# Patient Record
Sex: Female | Born: 1940 | Race: Black or African American | Hispanic: No | Marital: Married | Smoking: Never smoker
Health system: Southern US, Community
[De-identification: ages and names within clinical notes are randomized; demographics above are authoritative.]

## PROBLEM LIST (undated history)

## (undated) DIAGNOSIS — J349 Unspecified disorder of nose and nasal sinuses: Secondary | ICD-10-CM

## (undated) DIAGNOSIS — R040 Epistaxis: Secondary | ICD-10-CM

## (undated) DIAGNOSIS — R233 Spontaneous ecchymoses: Secondary | ICD-10-CM

## (undated) DIAGNOSIS — D649 Anemia, unspecified: Secondary | ICD-10-CM

## (undated) DIAGNOSIS — R238 Other skin changes: Secondary | ICD-10-CM

## (undated) HISTORY — DX: Other skin changes: R23.8

## (undated) HISTORY — PX: NOSE SURGERY: SHX723

## (undated) HISTORY — PX: OTHER SURGICAL HISTORY: SHX169

## (undated) HISTORY — DX: Unspecified disorder of nose and nasal sinuses: J34.9

## (undated) HISTORY — DX: Epistaxis: R04.0

## (undated) HISTORY — PX: FOOT SURGERY: SHX648

## (undated) HISTORY — DX: Anemia, unspecified: D64.9

## (undated) HISTORY — DX: Spontaneous ecchymoses: R23.3

---

## 2008-04-07 ENCOUNTER — Encounter: Payer: Self-pay | Admitting: Endocrinology

## 2008-10-12 ENCOUNTER — Encounter: Payer: Self-pay | Admitting: Endocrinology

## 2008-10-27 ENCOUNTER — Ambulatory Visit: Payer: Self-pay | Admitting: Endocrinology

## 2008-10-27 DIAGNOSIS — K219 Gastro-esophageal reflux disease without esophagitis: Secondary | ICD-10-CM

## 2008-10-27 DIAGNOSIS — M1990 Unspecified osteoarthritis, unspecified site: Secondary | ICD-10-CM | POA: Insufficient documentation

## 2008-10-27 DIAGNOSIS — F411 Generalized anxiety disorder: Secondary | ICD-10-CM | POA: Insufficient documentation

## 2008-10-27 DIAGNOSIS — E210 Primary hyperparathyroidism: Secondary | ICD-10-CM | POA: Insufficient documentation

## 2008-11-09 ENCOUNTER — Encounter: Payer: Self-pay | Admitting: Endocrinology

## 2008-11-09 ENCOUNTER — Ambulatory Visit: Payer: Self-pay | Admitting: Internal Medicine

## 2009-05-04 ENCOUNTER — Ambulatory Visit: Payer: Self-pay | Admitting: Endocrinology

## 2009-05-05 LAB — CONVERTED CEMR LAB
Calcium, Total (PTH): 9.7 mg/dL (ref 8.4–10.5)
PTH: 91.7 pg/mL — ABNORMAL HIGH (ref 14.0–72.0)

## 2014-05-06 ENCOUNTER — Ambulatory Visit (INDEPENDENT_AMBULATORY_CARE_PROVIDER_SITE_OTHER): Payer: Commercial Managed Care - HMO

## 2014-05-06 VITALS — BP 117/101 | HR 74 | Resp 16

## 2014-05-06 DIAGNOSIS — M79606 Pain in leg, unspecified: Secondary | ICD-10-CM

## 2014-05-06 DIAGNOSIS — Q828 Other specified congenital malformations of skin: Secondary | ICD-10-CM

## 2014-05-06 DIAGNOSIS — B070 Plantar wart: Secondary | ICD-10-CM

## 2014-05-06 NOTE — Progress Notes (Signed)
Subjective:     Patient ID: Jennifer Holden, female   DOB: 27-Feb-1941, 73 y.o.   MRN: [id number]  HPI There are some places on the bottom of my heels that has been going on for about 4 months and can't hardly walk and burns and throbs and gets numb and tingles at times and uses pads and I have had blood transfusions and hurts with shoes and broke my right leg 6 months ago   Review of Systems  Constitutional: Positive for chills, activity change and unexpected weight change.  HENT:       Ringing in ears  Eyes: Positive for itching and visual disturbance.  Respiratory: Positive for cough and shortness of breath.   Cardiovascular: Positive for chest pain and leg swelling.       Pain with walking (ankle)  Gastrointestinal: Positive for abdominal pain and constipation.  Endocrine: Positive for cold intolerance.       Increase urination  Musculoskeletal: Positive for back pain and gait problem.  Neurological: Positive for dizziness, weakness and numbness.  Hematological: Bruises/bleeds easily.  Psychiatric/Behavioral: The patient is nervous/anxious.   All other systems reviewed and are negative.      Objective:   Physical Exam Lower extremity findings as follows this 73 year old female process this time with a daughter-in-law with approxi-six-month history or longer history 4 lesions on the bottom of her foot inferior heel areas they're painful difficult to walk hardly keratotic lesions are noted. Patient is multiple petechial areas on both feet and throughout her body some bleeding or bruising issues of your nosebleeds all the time and they have difficult time controlling her bleeding patient is had multiple transfusions apparently. Lower extremity objective findings as follows vascular status reveals pulses are palpable DP and PT +2 over 4 bilateral capillary refill time 3 seconds all digits epicritic sensations intact and symmetric bilateral there is normal plantar response DTRs not elicited  dermatologically skin color pigment normal hair growth absent there are multiple petechiae around bottom of the foot dorsal foot and throughout the leg and body general. No open wounds no signs of infection the keratotic lesions have a hard darkened central area with multiple darkened spots consistent with verruca versus porokeratosis. 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     Plan:     Plan at this time did make an attempt at debriding D roofing the verruca on the right heel however immediately considerable amount of bleeding and drainage was noted from the pinpoint bleeds following attempted debridement we discontinued this lumicain and Neosporin and a gauze wrap were applied to the right foot and heel to help control the bleeding pressure was applied for more than 5 minutes to decrease the flow again a small pinpoint bleeding from the verruca was and it was identified and no additional debridement was recommended because of this also recommended no chemical treatment salicylic acid or anything that causes blistering which would infect likely create bleeding or other issues the contralateral foot was also not address at this time as any debridement would likely cause difficulty with controlling the bleeding recommendations time is guarded accommodative care. Some half-inch Plastizote heel cups are placed in both of her shoes with pockets for the verrucous lesions to help offload the areas large thick padded dressing was applied to the right heel following debridement today it was  dressed with Silvadene Neosporin and a large thick gauze dressing and Coflex wrap. Leave this in place for at least 24 hours until bleeding is under control. Contact us if there's any other issues or difficulties or bleeding fails to resolve in the next  24 hours. Patient and family member indicated there comfortable keeping bleeding under control because a constant issue to them. Dispensed literature on verruca may consider just applying topical duct tape with any chemical treatments or medications suggest follow-up in the future on an as-needed basis if there's any exacerbations changes or worsening  Harriet Masson DPM

## 2014-05-06 NOTE — Patient Instructions (Signed)
Plantar Warts Warts are benign (noncancerous) growths of the outer skin layer. They can occur at any time in life but are most common during childhood and the teen years. Warts can occur on many skin surfaces of the body. When they occur on the underside (sole) of your foot they are called plantar warts. They often emerge in groups with several small warts encircling a larger growth. CAUSES  Human papillomavirus (HPV) is the cause of plantar warts. HPV attacks a break in the skin of the foot. Walking barefoot can lead to exposure to the wart virus. Plantar warts tend to develop over areas of pressure such as the heel and ball of the foot. Plantar warts often grow into the deeper layers of skin. They may spread to other areas of the sole but cannot spread to other areas of the body. SYMPTOMS  You may also notice a growth on the undersurface of your foot. The wart may grow directly into the sole of the foot, or rise above the surface of the skin on the sole of the foot, or both. They are most often flat from pressure. Warts generally do not cause itching but may cause pain in the area of the wart when you put weight on your foot. DIAGNOSIS  Diagnosis is made by physical examination. This means your caregiver discovers it while examining your foot.  TREATMENT  There are many ways to treat plantar warts. However, warts are very tough. Sometimes it is difficult to treat them so that they go away completely and do not grow back. Any treatment must be done regularly to work. If left untreated, most plantar warts will eventually disappear over a period of one to two years. Treatments you can do at home include:  Putting duct tape over the top of the wart (occlusion) has been found to be effective over several months. The duct tape should be removed each night and reapplied until the wart has disappeared.  Placing over-the-counter medications on top of the wart to help kill the wart virus and remove the wart  tissue (salicylic acid, cantharidin, and dichloroacetic acid) are useful. These are called keratolytic agents. These medications make the skin soft and gradually layers will shed away. These compounds are usually placed on the wart each night and then covered with a bandage. They are also available in premedicated bandage form. Avoid surrounding skin when applying these liquids as these medications can burn healthy skin. The treatment may take several months of nightly use to be effective.  Cryotherapy to freeze the wart has recently become available over-the-counter for children 4 years and older. This system makes use of a soft narrow applicator connected to a bottle of compressed cold liquid that is applied directly to the wart. This medication can burn healthy skin and should be used with caution.  As with all over-the-counter medications, read the directions carefully before use. Treatments generally done in your caregiver's office include:  Some aggressive treatments may cause discomfort, discoloration, and scarring of the surrounding skin. The risks and benefits of treatment should be discussed with your caregiver.  Freezing the wart with liquid nitrogen (cryotherapy, see above).  Burning the wart with use of very high heat (cautery).  Injecting medication into the wart.  Surgically removing or laser treatment of the wart.  Your caregiver may refer you to a dermatologist for difficult to treat large-sized warts or large numbers of warts. HOME CARE INSTRUCTIONS   Soak the affected area in warm water. Dry the   area completely when you are done. Remove the top layer of softened skin, then apply the chosen topical medication and reapply a bandage.  Remove the bandage daily and file excess wart tissue (pumice stone works well for this purpose). Repeat the entire process daily or every other day for weeks until the plantar wart disappears.  Several brands of salicylic acid pads are available  as over-the-counter remedies.  Pain can be relieved by wearing a donut bandage. This is a bandage with a hole in it. The bandage is put on with the hole over the wart. This helps take the pressure off the wart and gives pain relief. To help prevent plantar warts:  Wear shoes and socks and change them daily.  Keep feet clean and dry.  Check your feet and your children's feet regularly.  Avoid direct contact with warts on other people.  Have growths or changes on your skin checked by your caregiver. Document Released: 08/31/2003 Document Revised: 10/25/2013 Document Reviewed: 02/08/2009 [facility] Patient Information 2015 [place], [place]. This information is not intended to replace advice given to you by your health care provider. Make sure you discuss any questions you have with your health care provider.   Because of the fragility of skin and severe bleeding issues I do not recommend any topical medications wart medications are acids or debriding agents.  Recommend keeping a cushion on the bottom of the foot to offload pressure to the painful lesions, might consider covering these lesions with duct tape to try to suffocate or dry out the lesions without putting any chemical medications on.

## 2014-06-27 DIAGNOSIS — Z0000 Encounter for general adult medical examination without abnormal findings: Secondary | ICD-10-CM | POA: Diagnosis not present

## 2014-06-27 DIAGNOSIS — M810 Age-related osteoporosis without current pathological fracture: Secondary | ICD-10-CM | POA: Diagnosis not present

## 2014-06-27 DIAGNOSIS — D649 Anemia, unspecified: Secondary | ICD-10-CM | POA: Diagnosis not present

## 2014-06-27 DIAGNOSIS — E039 Hypothyroidism, unspecified: Secondary | ICD-10-CM | POA: Diagnosis not present

## 2014-06-27 DIAGNOSIS — G894 Chronic pain syndrome: Secondary | ICD-10-CM | POA: Diagnosis not present

## 2014-06-27 DIAGNOSIS — Z1389 Encounter for screening for other disorder: Secondary | ICD-10-CM | POA: Diagnosis not present

## 2014-06-27 DIAGNOSIS — E785 Hyperlipidemia, unspecified: Secondary | ICD-10-CM | POA: Diagnosis not present

## 2014-07-01 DIAGNOSIS — D649 Anemia, unspecified: Secondary | ICD-10-CM | POA: Diagnosis not present

## 2014-07-08 DIAGNOSIS — D649 Anemia, unspecified: Secondary | ICD-10-CM | POA: Diagnosis not present

## 2014-07-27 DIAGNOSIS — D649 Anemia, unspecified: Secondary | ICD-10-CM | POA: Diagnosis not present

## 2014-07-27 DIAGNOSIS — S161XXA Strain of muscle, fascia and tendon at neck level, initial encounter: Secondary | ICD-10-CM | POA: Diagnosis not present

## 2014-08-24 DIAGNOSIS — M7122 Synovial cyst of popliteal space [Baker], left knee: Secondary | ICD-10-CM | POA: Diagnosis not present

## 2014-08-24 DIAGNOSIS — M79605 Pain in left leg: Secondary | ICD-10-CM | POA: Diagnosis not present

## 2014-08-24 DIAGNOSIS — M7989 Other specified soft tissue disorders: Secondary | ICD-10-CM | POA: Diagnosis not present

## 2014-08-24 DIAGNOSIS — D649 Anemia, unspecified: Secondary | ICD-10-CM | POA: Diagnosis not present

## 2014-08-26 DIAGNOSIS — G894 Chronic pain syndrome: Secondary | ICD-10-CM | POA: Diagnosis not present

## 2014-08-26 DIAGNOSIS — D649 Anemia, unspecified: Secondary | ICD-10-CM | POA: Diagnosis not present

## 2014-09-27 DIAGNOSIS — D649 Anemia, unspecified: Secondary | ICD-10-CM | POA: Diagnosis not present

## 2014-10-12 DIAGNOSIS — D649 Anemia, unspecified: Secondary | ICD-10-CM | POA: Diagnosis not present

## 2014-11-09 DIAGNOSIS — R5383 Other fatigue: Secondary | ICD-10-CM | POA: Diagnosis not present

## 2014-11-09 DIAGNOSIS — D649 Anemia, unspecified: Secondary | ICD-10-CM | POA: Diagnosis not present

## 2014-11-15 DIAGNOSIS — R5383 Other fatigue: Secondary | ICD-10-CM | POA: Diagnosis not present

## 2014-11-15 DIAGNOSIS — D649 Anemia, unspecified: Secondary | ICD-10-CM | POA: Diagnosis not present

## 2014-12-12 DIAGNOSIS — M549 Dorsalgia, unspecified: Secondary | ICD-10-CM | POA: Diagnosis not present

## 2014-12-12 DIAGNOSIS — D649 Anemia, unspecified: Secondary | ICD-10-CM | POA: Diagnosis not present

## 2014-12-12 DIAGNOSIS — G894 Chronic pain syndrome: Secondary | ICD-10-CM | POA: Diagnosis not present

## 2014-12-12 DIAGNOSIS — Z79899 Other long term (current) drug therapy: Secondary | ICD-10-CM | POA: Diagnosis not present

## 2014-12-13 DIAGNOSIS — D509 Iron deficiency anemia, unspecified: Secondary | ICD-10-CM | POA: Diagnosis not present

## 2014-12-14 DIAGNOSIS — D509 Iron deficiency anemia, unspecified: Secondary | ICD-10-CM | POA: Diagnosis not present

## 2014-12-15 DIAGNOSIS — D509 Iron deficiency anemia, unspecified: Secondary | ICD-10-CM | POA: Diagnosis not present

## 2015-02-13 DIAGNOSIS — D649 Anemia, unspecified: Secondary | ICD-10-CM | POA: Diagnosis not present

## 2015-02-13 DIAGNOSIS — F411 Generalized anxiety disorder: Secondary | ICD-10-CM | POA: Diagnosis not present

## 2015-02-13 DIAGNOSIS — G894 Chronic pain syndrome: Secondary | ICD-10-CM | POA: Diagnosis not present

## 2015-02-13 DIAGNOSIS — Z79899 Other long term (current) drug therapy: Secondary | ICD-10-CM | POA: Diagnosis not present

## 2015-03-06 ENCOUNTER — Ambulatory Visit: Payer: Commercial Managed Care - HMO | Admitting: Podiatry

## 2015-03-13 ENCOUNTER — Ambulatory Visit (INDEPENDENT_AMBULATORY_CARE_PROVIDER_SITE_OTHER): Payer: Commercial Managed Care - HMO | Admitting: Podiatry

## 2015-03-13 DIAGNOSIS — Q828 Other specified congenital malformations of skin: Secondary | ICD-10-CM | POA: Diagnosis not present

## 2015-03-13 DIAGNOSIS — M79606 Pain in leg, unspecified: Secondary | ICD-10-CM

## 2015-03-13 NOTE — Progress Notes (Signed)
Subjective:     Patient ID: Jennifer Holden, female   DOB: 1940-08-13, 74 y.o.   MRN: [id number]  HPIThis patient returns to the office for two painful callus lesions on the bottom of both heels  She says she has severe pain walking in her heels due to her calluses.  She had severe bleeding event when Dr. Blenda Mounts trimmed her calluses and dramatic bleeding followed on her last visit.  She has refused to allow me to trim her lesions due to the bleeding experience.  She desires an alternative treatment. She does admit multiple transfusions leading to her to believe she is a free bleeder.   Review of Systems     Objective:   Physical Exam GENERAL APPEARANCE: Alert, conversant. Appropriately groomed. No acute distress.  VASCULAR: Pedal pulses palpable at  [facility] and PT bilateral.  Capillary refill time is immediate to all digits,  Normal temperature gradient.  Digital hair growth is present bilateral Multiple petechia noted both feet. NEUROLOGIC: sensation is normal to 5.07 monofilament at 5/5 sites bilateral.  Light touch is intact bilateral, Muscle strength normal.  MUSCULOSKELETAL: acceptable muscle strength, tone and stability bilateral.  Intrinsic muscluature intact bilateral.  Rectus appearance of foot and digits noted bilateral.   DERMATOLOGIC: skin color, texture, and turgor are within normal limits.  No preulcerative lesions or ulcers  are seen, no interdigital maceration noted.  No open lesions present.  Digital nails are asymptomatic. No drainage noted.There are individual porokeratotic lesions on both heels which are painful to palpation.  No drainage noted.       Assessment:     Porokeratosis Heels B/l.     Plan:     ROV>  Since the patient refuses traditional treatment of lesions we discussed her feet and decided to add additional pads to her already dispensed insoles.

## 2015-05-01 DIAGNOSIS — G894 Chronic pain syndrome: Secondary | ICD-10-CM | POA: Diagnosis not present

## 2015-05-01 DIAGNOSIS — F411 Generalized anxiety disorder: Secondary | ICD-10-CM | POA: Diagnosis not present

## 2015-05-01 DIAGNOSIS — D500 Iron deficiency anemia secondary to blood loss (chronic): Secondary | ICD-10-CM | POA: Diagnosis not present

## 2015-05-01 DIAGNOSIS — Z79899 Other long term (current) drug therapy: Secondary | ICD-10-CM | POA: Diagnosis not present

## 2015-05-26 DIAGNOSIS — K219 Gastro-esophageal reflux disease without esophagitis: Secondary | ICD-10-CM | POA: Diagnosis not present

## 2015-06-29 DIAGNOSIS — D594 Other nonautoimmune hemolytic anemias: Secondary | ICD-10-CM | POA: Diagnosis not present

## 2015-06-29 DIAGNOSIS — D509 Iron deficiency anemia, unspecified: Secondary | ICD-10-CM | POA: Diagnosis not present

## 2015-06-29 DIAGNOSIS — Z1389 Encounter for screening for other disorder: Secondary | ICD-10-CM | POA: Diagnosis not present

## 2015-06-29 DIAGNOSIS — E039 Hypothyroidism, unspecified: Secondary | ICD-10-CM | POA: Diagnosis not present

## 2015-06-29 DIAGNOSIS — Z0000 Encounter for general adult medical examination without abnormal findings: Secondary | ICD-10-CM | POA: Diagnosis not present

## 2015-06-29 DIAGNOSIS — K219 Gastro-esophageal reflux disease without esophagitis: Secondary | ICD-10-CM | POA: Diagnosis not present

## 2015-06-29 DIAGNOSIS — F411 Generalized anxiety disorder: Secondary | ICD-10-CM | POA: Diagnosis not present

## 2015-06-29 DIAGNOSIS — E7800 Pure hypercholesterolemia, unspecified: Secondary | ICD-10-CM | POA: Diagnosis not present

## 2015-06-29 DIAGNOSIS — M810 Age-related osteoporosis without current pathological fracture: Secondary | ICD-10-CM | POA: Diagnosis not present

## 2015-07-12 DIAGNOSIS — Z1231 Encounter for screening mammogram for malignant neoplasm of breast: Secondary | ICD-10-CM | POA: Diagnosis not present

## 2015-07-12 DIAGNOSIS — M810 Age-related osteoporosis without current pathological fracture: Secondary | ICD-10-CM | POA: Diagnosis not present

## 2015-08-22 DIAGNOSIS — D649 Anemia, unspecified: Secondary | ICD-10-CM | POA: Diagnosis not present

## 2015-08-22 DIAGNOSIS — E648 Sequelae of other nutritional deficiencies: Secondary | ICD-10-CM | POA: Diagnosis not present

## 2015-08-25 DIAGNOSIS — G894 Chronic pain syndrome: Secondary | ICD-10-CM | POA: Diagnosis not present

## 2015-08-25 DIAGNOSIS — Z5181 Encounter for therapeutic drug level monitoring: Secondary | ICD-10-CM | POA: Diagnosis not present

## 2015-10-30 DIAGNOSIS — G894 Chronic pain syndrome: Secondary | ICD-10-CM | POA: Diagnosis not present

## 2015-10-30 DIAGNOSIS — Z5181 Encounter for therapeutic drug level monitoring: Secondary | ICD-10-CM | POA: Diagnosis not present

## 2015-10-30 DIAGNOSIS — D500 Iron deficiency anemia secondary to blood loss (chronic): Secondary | ICD-10-CM | POA: Diagnosis not present

## 2015-10-30 DIAGNOSIS — F411 Generalized anxiety disorder: Secondary | ICD-10-CM | POA: Diagnosis not present

## 2016-01-29 DIAGNOSIS — F411 Generalized anxiety disorder: Secondary | ICD-10-CM | POA: Diagnosis not present

## 2016-01-29 DIAGNOSIS — Z9114 Patient's other noncompliance with medication regimen: Secondary | ICD-10-CM | POA: Diagnosis not present

## 2016-01-29 DIAGNOSIS — D649 Anemia, unspecified: Secondary | ICD-10-CM | POA: Diagnosis not present

## 2016-01-29 DIAGNOSIS — G894 Chronic pain syndrome: Secondary | ICD-10-CM | POA: Diagnosis not present

## 2016-01-29 DIAGNOSIS — Z79899 Other long term (current) drug therapy: Secondary | ICD-10-CM | POA: Diagnosis not present

## 2016-01-29 DIAGNOSIS — Z5181 Encounter for therapeutic drug level monitoring: Secondary | ICD-10-CM | POA: Diagnosis not present

## 2016-02-13 DIAGNOSIS — I780 Hereditary hemorrhagic telangiectasia: Secondary | ICD-10-CM | POA: Diagnosis not present

## 2016-02-13 DIAGNOSIS — D509 Iron deficiency anemia, unspecified: Secondary | ICD-10-CM | POA: Diagnosis not present

## 2016-02-13 DIAGNOSIS — K219 Gastro-esophageal reflux disease without esophagitis: Secondary | ICD-10-CM | POA: Diagnosis not present

## 2016-02-13 DIAGNOSIS — R531 Weakness: Secondary | ICD-10-CM | POA: Diagnosis not present

## 2016-02-13 DIAGNOSIS — T8119XA Other postprocedural shock, initial encounter: Secondary | ICD-10-CM | POA: Diagnosis not present

## 2016-02-13 DIAGNOSIS — M1990 Unspecified osteoarthritis, unspecified site: Secondary | ICD-10-CM | POA: Diagnosis not present

## 2016-02-13 DIAGNOSIS — R5383 Other fatigue: Secondary | ICD-10-CM | POA: Diagnosis not present

## 2016-02-13 DIAGNOSIS — R58 Hemorrhage, not elsewhere classified: Secondary | ICD-10-CM | POA: Diagnosis not present

## 2016-02-13 DIAGNOSIS — G9341 Metabolic encephalopathy: Secondary | ICD-10-CM | POA: Diagnosis not present

## 2016-02-13 DIAGNOSIS — D589 Hereditary hemolytic anemia, unspecified: Secondary | ICD-10-CM | POA: Diagnosis not present

## 2016-02-13 DIAGNOSIS — R4182 Altered mental status, unspecified: Secondary | ICD-10-CM | POA: Diagnosis not present

## 2016-02-13 DIAGNOSIS — E039 Hypothyroidism, unspecified: Secondary | ICD-10-CM | POA: Diagnosis not present

## 2016-02-13 DIAGNOSIS — R040 Epistaxis: Secondary | ICD-10-CM | POA: Diagnosis not present

## 2016-02-13 DIAGNOSIS — D62 Acute posthemorrhagic anemia: Secondary | ICD-10-CM | POA: Diagnosis not present

## 2016-02-14 DIAGNOSIS — R040 Epistaxis: Secondary | ICD-10-CM | POA: Diagnosis not present

## 2016-02-14 DIAGNOSIS — D62 Acute posthemorrhagic anemia: Secondary | ICD-10-CM

## 2016-02-22 DIAGNOSIS — D649 Anemia, unspecified: Secondary | ICD-10-CM | POA: Diagnosis not present

## 2016-02-25 DIAGNOSIS — D62 Acute posthemorrhagic anemia: Secondary | ICD-10-CM | POA: Diagnosis not present

## 2016-02-25 DIAGNOSIS — R040 Epistaxis: Secondary | ICD-10-CM | POA: Diagnosis not present

## 2016-02-25 DIAGNOSIS — D509 Iron deficiency anemia, unspecified: Secondary | ICD-10-CM | POA: Diagnosis not present

## 2016-02-26 DIAGNOSIS — D62 Acute posthemorrhagic anemia: Secondary | ICD-10-CM | POA: Diagnosis not present

## 2016-02-26 DIAGNOSIS — R040 Epistaxis: Secondary | ICD-10-CM | POA: Diagnosis not present

## 2016-02-27 DIAGNOSIS — R040 Epistaxis: Secondary | ICD-10-CM | POA: Diagnosis not present

## 2016-02-27 DIAGNOSIS — D500 Iron deficiency anemia secondary to blood loss (chronic): Secondary | ICD-10-CM | POA: Diagnosis not present

## 2016-02-27 DIAGNOSIS — D589 Hereditary hemolytic anemia, unspecified: Secondary | ICD-10-CM | POA: Diagnosis not present

## 2016-02-27 DIAGNOSIS — I770 Arteriovenous fistula, acquired: Secondary | ICD-10-CM | POA: Diagnosis not present

## 2016-02-27 DIAGNOSIS — Z79899 Other long term (current) drug therapy: Secondary | ICD-10-CM | POA: Diagnosis not present

## 2016-02-27 DIAGNOSIS — F17200 Nicotine dependence, unspecified, uncomplicated: Secondary | ICD-10-CM | POA: Diagnosis not present

## 2016-02-27 DIAGNOSIS — I781 Nevus, non-neoplastic: Secondary | ICD-10-CM | POA: Diagnosis not present

## 2016-02-27 DIAGNOSIS — D62 Acute posthemorrhagic anemia: Secondary | ICD-10-CM | POA: Diagnosis not present

## 2016-02-28 DIAGNOSIS — I781 Nevus, non-neoplastic: Secondary | ICD-10-CM | POA: Diagnosis not present

## 2016-02-28 DIAGNOSIS — R040 Epistaxis: Secondary | ICD-10-CM | POA: Diagnosis not present

## 2016-02-28 DIAGNOSIS — D589 Hereditary hemolytic anemia, unspecified: Secondary | ICD-10-CM | POA: Diagnosis not present

## 2016-02-28 DIAGNOSIS — D500 Iron deficiency anemia secondary to blood loss (chronic): Secondary | ICD-10-CM | POA: Diagnosis not present

## 2016-02-28 DIAGNOSIS — D62 Acute posthemorrhagic anemia: Secondary | ICD-10-CM | POA: Diagnosis not present

## 2016-03-01 DIAGNOSIS — I780 Hereditary hemorrhagic telangiectasia: Secondary | ICD-10-CM | POA: Diagnosis not present

## 2016-03-01 DIAGNOSIS — R040 Epistaxis: Secondary | ICD-10-CM | POA: Diagnosis not present

## 2016-03-04 DIAGNOSIS — D589 Hereditary hemolytic anemia, unspecified: Secondary | ICD-10-CM | POA: Diagnosis not present

## 2016-03-04 DIAGNOSIS — I781 Nevus, non-neoplastic: Secondary | ICD-10-CM | POA: Diagnosis not present

## 2016-03-04 DIAGNOSIS — D72819 Decreased white blood cell count, unspecified: Secondary | ICD-10-CM

## 2016-03-04 DIAGNOSIS — M7122 Synovial cyst of popliteal space [Baker], left knee: Secondary | ICD-10-CM | POA: Diagnosis not present

## 2016-03-04 DIAGNOSIS — I780 Hereditary hemorrhagic telangiectasia: Secondary | ICD-10-CM | POA: Diagnosis not present

## 2016-03-08 DIAGNOSIS — F17200 Nicotine dependence, unspecified, uncomplicated: Secondary | ICD-10-CM | POA: Diagnosis not present

## 2016-03-08 DIAGNOSIS — D582 Other hemoglobinopathies: Secondary | ICD-10-CM | POA: Diagnosis not present

## 2016-03-08 DIAGNOSIS — Z79899 Other long term (current) drug therapy: Secondary | ICD-10-CM | POA: Diagnosis not present

## 2016-03-08 DIAGNOSIS — D62 Acute posthemorrhagic anemia: Secondary | ICD-10-CM | POA: Diagnosis not present

## 2016-03-08 DIAGNOSIS — D500 Iron deficiency anemia secondary to blood loss (chronic): Secondary | ICD-10-CM | POA: Diagnosis not present

## 2016-03-08 DIAGNOSIS — R040 Epistaxis: Secondary | ICD-10-CM | POA: Diagnosis not present

## 2016-03-09 DIAGNOSIS — D500 Iron deficiency anemia secondary to blood loss (chronic): Secondary | ICD-10-CM | POA: Diagnosis not present

## 2016-03-09 DIAGNOSIS — R040 Epistaxis: Secondary | ICD-10-CM | POA: Diagnosis not present

## 2016-03-14 DIAGNOSIS — Z880 Allergy status to penicillin: Secondary | ICD-10-CM | POA: Diagnosis not present

## 2016-03-14 DIAGNOSIS — Z882 Allergy status to sulfonamides status: Secondary | ICD-10-CM | POA: Diagnosis not present

## 2016-03-14 DIAGNOSIS — Z886 Allergy status to analgesic agent status: Secondary | ICD-10-CM | POA: Diagnosis not present

## 2016-03-14 DIAGNOSIS — Z79899 Other long term (current) drug therapy: Secondary | ICD-10-CM | POA: Diagnosis not present

## 2016-03-14 DIAGNOSIS — R040 Epistaxis: Secondary | ICD-10-CM | POA: Diagnosis not present

## 2016-03-14 DIAGNOSIS — I780 Hereditary hemorrhagic telangiectasia: Secondary | ICD-10-CM | POA: Diagnosis not present

## 2016-03-14 DIAGNOSIS — J3489 Other specified disorders of nose and nasal sinuses: Secondary | ICD-10-CM | POA: Diagnosis not present

## 2016-03-21 DIAGNOSIS — F419 Anxiety disorder, unspecified: Secondary | ICD-10-CM | POA: Diagnosis not present

## 2016-03-21 DIAGNOSIS — K219 Gastro-esophageal reflux disease without esophagitis: Secondary | ICD-10-CM | POA: Diagnosis not present

## 2016-03-21 DIAGNOSIS — R040 Epistaxis: Secondary | ICD-10-CM | POA: Diagnosis not present

## 2016-03-21 DIAGNOSIS — G4700 Insomnia, unspecified: Secondary | ICD-10-CM | POA: Diagnosis not present

## 2016-03-21 DIAGNOSIS — I780 Hereditary hemorrhagic telangiectasia: Secondary | ICD-10-CM | POA: Diagnosis not present

## 2016-03-21 DIAGNOSIS — Q2739 Arteriovenous malformation, other site: Secondary | ICD-10-CM | POA: Diagnosis not present

## 2016-03-21 DIAGNOSIS — E559 Vitamin D deficiency, unspecified: Secondary | ICD-10-CM | POA: Diagnosis not present

## 2016-03-21 DIAGNOSIS — I491 Atrial premature depolarization: Secondary | ICD-10-CM | POA: Diagnosis not present

## 2016-03-21 DIAGNOSIS — Z880 Allergy status to penicillin: Secondary | ICD-10-CM | POA: Diagnosis not present

## 2016-03-21 DIAGNOSIS — Z7982 Long term (current) use of aspirin: Secondary | ICD-10-CM | POA: Diagnosis not present

## 2016-03-26 DIAGNOSIS — F419 Anxiety disorder, unspecified: Secondary | ICD-10-CM | POA: Diagnosis not present

## 2016-03-26 DIAGNOSIS — E559 Vitamin D deficiency, unspecified: Secondary | ICD-10-CM | POA: Diagnosis not present

## 2016-03-26 DIAGNOSIS — Q2739 Arteriovenous malformation, other site: Secondary | ICD-10-CM | POA: Diagnosis not present

## 2016-03-26 DIAGNOSIS — I780 Hereditary hemorrhagic telangiectasia: Secondary | ICD-10-CM | POA: Diagnosis not present

## 2016-03-26 DIAGNOSIS — Z880 Allergy status to penicillin: Secondary | ICD-10-CM | POA: Diagnosis not present

## 2016-03-26 DIAGNOSIS — R040 Epistaxis: Secondary | ICD-10-CM | POA: Diagnosis not present

## 2016-03-26 DIAGNOSIS — Z7982 Long term (current) use of aspirin: Secondary | ICD-10-CM | POA: Diagnosis not present

## 2016-03-26 DIAGNOSIS — G4700 Insomnia, unspecified: Secondary | ICD-10-CM | POA: Diagnosis not present

## 2016-03-26 DIAGNOSIS — K219 Gastro-esophageal reflux disease without esophagitis: Secondary | ICD-10-CM | POA: Diagnosis not present

## 2016-03-27 DIAGNOSIS — Q2739 Arteriovenous malformation, other site: Secondary | ICD-10-CM | POA: Diagnosis not present

## 2016-03-27 DIAGNOSIS — R040 Epistaxis: Secondary | ICD-10-CM | POA: Diagnosis not present

## 2016-03-27 DIAGNOSIS — F419 Anxiety disorder, unspecified: Secondary | ICD-10-CM | POA: Diagnosis not present

## 2016-03-27 DIAGNOSIS — E559 Vitamin D deficiency, unspecified: Secondary | ICD-10-CM | POA: Diagnosis not present

## 2016-03-27 DIAGNOSIS — Z7982 Long term (current) use of aspirin: Secondary | ICD-10-CM | POA: Diagnosis not present

## 2016-03-27 DIAGNOSIS — G4700 Insomnia, unspecified: Secondary | ICD-10-CM | POA: Diagnosis not present

## 2016-03-27 DIAGNOSIS — Z880 Allergy status to penicillin: Secondary | ICD-10-CM | POA: Diagnosis not present

## 2016-03-27 DIAGNOSIS — I780 Hereditary hemorrhagic telangiectasia: Secondary | ICD-10-CM | POA: Diagnosis not present

## 2016-03-27 DIAGNOSIS — K219 Gastro-esophageal reflux disease without esophagitis: Secondary | ICD-10-CM | POA: Diagnosis not present

## 2016-03-29 DIAGNOSIS — G894 Chronic pain syndrome: Secondary | ICD-10-CM | POA: Diagnosis not present

## 2016-03-29 DIAGNOSIS — Z79899 Other long term (current) drug therapy: Secondary | ICD-10-CM | POA: Diagnosis not present

## 2016-03-29 DIAGNOSIS — Z5181 Encounter for therapeutic drug level monitoring: Secondary | ICD-10-CM | POA: Diagnosis not present

## 2016-03-29 DIAGNOSIS — D649 Anemia, unspecified: Secondary | ICD-10-CM | POA: Diagnosis not present

## 2016-04-04 DIAGNOSIS — R609 Edema, unspecified: Secondary | ICD-10-CM | POA: Diagnosis not present

## 2016-04-04 DIAGNOSIS — M7989 Other specified soft tissue disorders: Secondary | ICD-10-CM | POA: Diagnosis not present

## 2016-04-04 DIAGNOSIS — M7122 Synovial cyst of popliteal space [Baker], left knee: Secondary | ICD-10-CM | POA: Diagnosis not present

## 2016-04-08 DIAGNOSIS — R040 Epistaxis: Secondary | ICD-10-CM | POA: Diagnosis not present

## 2016-04-08 DIAGNOSIS — I780 Hereditary hemorrhagic telangiectasia: Secondary | ICD-10-CM | POA: Diagnosis not present

## 2016-04-08 DIAGNOSIS — D649 Anemia, unspecified: Secondary | ICD-10-CM | POA: Diagnosis not present

## 2016-04-08 DIAGNOSIS — D62 Acute posthemorrhagic anemia: Secondary | ICD-10-CM | POA: Diagnosis not present

## 2016-04-08 DIAGNOSIS — Q2739 Arteriovenous malformation, other site: Secondary | ICD-10-CM | POA: Diagnosis not present

## 2016-04-08 DIAGNOSIS — I2510 Atherosclerotic heart disease of native coronary artery without angina pectoris: Secondary | ICD-10-CM | POA: Diagnosis not present

## 2016-04-08 DIAGNOSIS — J3489 Other specified disorders of nose and nasal sinuses: Secondary | ICD-10-CM | POA: Diagnosis not present

## 2016-04-08 DIAGNOSIS — D582 Other hemoglobinopathies: Secondary | ICD-10-CM | POA: Diagnosis not present

## 2016-04-08 DIAGNOSIS — E039 Hypothyroidism, unspecified: Secondary | ICD-10-CM | POA: Diagnosis not present

## 2016-04-08 DIAGNOSIS — R58 Hemorrhage, not elsewhere classified: Secondary | ICD-10-CM | POA: Diagnosis not present

## 2016-04-08 DIAGNOSIS — F419 Anxiety disorder, unspecified: Secondary | ICD-10-CM | POA: Diagnosis not present

## 2016-04-08 DIAGNOSIS — Z79899 Other long term (current) drug therapy: Secondary | ICD-10-CM | POA: Diagnosis not present

## 2016-04-09 DIAGNOSIS — D649 Anemia, unspecified: Secondary | ICD-10-CM | POA: Diagnosis not present

## 2016-04-09 DIAGNOSIS — R040 Epistaxis: Secondary | ICD-10-CM | POA: Diagnosis not present

## 2016-04-09 DIAGNOSIS — J3489 Other specified disorders of nose and nasal sinuses: Secondary | ICD-10-CM | POA: Diagnosis not present

## 2016-04-09 DIAGNOSIS — I780 Hereditary hemorrhagic telangiectasia: Secondary | ICD-10-CM | POA: Diagnosis not present

## 2016-04-12 DIAGNOSIS — R040 Epistaxis: Secondary | ICD-10-CM | POA: Diagnosis not present

## 2016-04-12 DIAGNOSIS — J342 Deviated nasal septum: Secondary | ICD-10-CM | POA: Diagnosis not present

## 2016-04-12 DIAGNOSIS — J3489 Other specified disorders of nose and nasal sinuses: Secondary | ICD-10-CM | POA: Diagnosis not present

## 2016-04-12 DIAGNOSIS — D649 Anemia, unspecified: Secondary | ICD-10-CM | POA: Diagnosis not present

## 2016-04-12 DIAGNOSIS — I780 Hereditary hemorrhagic telangiectasia: Secondary | ICD-10-CM | POA: Diagnosis not present

## 2016-04-16 DIAGNOSIS — J3489 Other specified disorders of nose and nasal sinuses: Secondary | ICD-10-CM | POA: Diagnosis not present

## 2016-04-16 DIAGNOSIS — D649 Anemia, unspecified: Secondary | ICD-10-CM | POA: Diagnosis not present

## 2016-04-16 DIAGNOSIS — R040 Epistaxis: Secondary | ICD-10-CM | POA: Diagnosis not present

## 2016-04-16 DIAGNOSIS — I780 Hereditary hemorrhagic telangiectasia: Secondary | ICD-10-CM | POA: Diagnosis not present

## 2016-04-22 DIAGNOSIS — R040 Epistaxis: Secondary | ICD-10-CM | POA: Diagnosis not present

## 2016-04-22 DIAGNOSIS — I780 Hereditary hemorrhagic telangiectasia: Secondary | ICD-10-CM | POA: Diagnosis not present

## 2016-04-22 DIAGNOSIS — J3489 Other specified disorders of nose and nasal sinuses: Secondary | ICD-10-CM | POA: Diagnosis not present

## 2016-04-23 DIAGNOSIS — D649 Anemia, unspecified: Secondary | ICD-10-CM | POA: Diagnosis not present

## 2016-04-23 DIAGNOSIS — F419 Anxiety disorder, unspecified: Secondary | ICD-10-CM | POA: Diagnosis not present

## 2016-04-23 DIAGNOSIS — G894 Chronic pain syndrome: Secondary | ICD-10-CM | POA: Diagnosis not present

## 2016-04-23 DIAGNOSIS — E559 Vitamin D deficiency, unspecified: Secondary | ICD-10-CM | POA: Diagnosis not present

## 2016-04-23 DIAGNOSIS — Z79899 Other long term (current) drug therapy: Secondary | ICD-10-CM | POA: Diagnosis not present

## 2016-04-23 DIAGNOSIS — M810 Age-related osteoporosis without current pathological fracture: Secondary | ICD-10-CM | POA: Diagnosis not present

## 2016-04-23 DIAGNOSIS — Z5181 Encounter for therapeutic drug level monitoring: Secondary | ICD-10-CM | POA: Diagnosis not present

## 2016-04-24 DIAGNOSIS — J3489 Other specified disorders of nose and nasal sinuses: Secondary | ICD-10-CM | POA: Diagnosis not present

## 2016-04-24 DIAGNOSIS — R040 Epistaxis: Secondary | ICD-10-CM | POA: Diagnosis not present

## 2016-04-24 DIAGNOSIS — T81718A Complication of other artery following a procedure, not elsewhere classified, initial encounter: Secondary | ICD-10-CM | POA: Diagnosis not present

## 2016-04-24 DIAGNOSIS — I780 Hereditary hemorrhagic telangiectasia: Secondary | ICD-10-CM | POA: Diagnosis not present

## 2016-04-29 DIAGNOSIS — J3489 Other specified disorders of nose and nasal sinuses: Secondary | ICD-10-CM | POA: Diagnosis not present

## 2016-04-29 DIAGNOSIS — R040 Epistaxis: Secondary | ICD-10-CM | POA: Diagnosis not present

## 2016-04-29 DIAGNOSIS — I780 Hereditary hemorrhagic telangiectasia: Secondary | ICD-10-CM | POA: Diagnosis not present

## 2016-06-20 DIAGNOSIS — R600 Localized edema: Secondary | ICD-10-CM | POA: Diagnosis not present

## 2016-06-21 DIAGNOSIS — Z5181 Encounter for therapeutic drug level monitoring: Secondary | ICD-10-CM | POA: Diagnosis not present

## 2016-06-21 DIAGNOSIS — D649 Anemia, unspecified: Secondary | ICD-10-CM | POA: Diagnosis not present

## 2016-06-21 DIAGNOSIS — Z79899 Other long term (current) drug therapy: Secondary | ICD-10-CM | POA: Diagnosis not present

## 2016-06-21 DIAGNOSIS — G894 Chronic pain syndrome: Secondary | ICD-10-CM | POA: Diagnosis not present

## 2016-06-21 DIAGNOSIS — F411 Generalized anxiety disorder: Secondary | ICD-10-CM | POA: Diagnosis not present

## 2016-06-21 DIAGNOSIS — I872 Venous insufficiency (chronic) (peripheral): Secondary | ICD-10-CM | POA: Diagnosis not present

## 2016-07-15 DIAGNOSIS — D179 Benign lipomatous neoplasm, unspecified: Secondary | ICD-10-CM | POA: Diagnosis not present

## 2016-07-15 DIAGNOSIS — D649 Anemia, unspecified: Secondary | ICD-10-CM | POA: Diagnosis not present

## 2016-07-16 DIAGNOSIS — D649 Anemia, unspecified: Secondary | ICD-10-CM | POA: Diagnosis not present

## 2016-07-16 DIAGNOSIS — R531 Weakness: Secondary | ICD-10-CM | POA: Diagnosis not present

## 2016-07-17 DIAGNOSIS — E039 Hypothyroidism, unspecified: Secondary | ICD-10-CM | POA: Diagnosis not present

## 2016-07-17 DIAGNOSIS — M1990 Unspecified osteoarthritis, unspecified site: Secondary | ICD-10-CM | POA: Diagnosis not present

## 2016-07-17 DIAGNOSIS — D582 Other hemoglobinopathies: Secondary | ICD-10-CM | POA: Diagnosis not present

## 2016-07-17 DIAGNOSIS — Z888 Allergy status to other drugs, medicaments and biological substances status: Secondary | ICD-10-CM | POA: Diagnosis not present

## 2016-07-17 DIAGNOSIS — Z880 Allergy status to penicillin: Secondary | ICD-10-CM | POA: Diagnosis not present

## 2016-07-17 DIAGNOSIS — Z66 Do not resuscitate: Secondary | ICD-10-CM | POA: Diagnosis not present

## 2016-07-17 DIAGNOSIS — I780 Hereditary hemorrhagic telangiectasia: Secondary | ICD-10-CM | POA: Diagnosis not present

## 2016-07-17 DIAGNOSIS — D509 Iron deficiency anemia, unspecified: Secondary | ICD-10-CM | POA: Diagnosis not present

## 2016-07-17 DIAGNOSIS — Z882 Allergy status to sulfonamides status: Secondary | ICD-10-CM | POA: Diagnosis not present

## 2016-07-17 DIAGNOSIS — R531 Weakness: Secondary | ICD-10-CM | POA: Diagnosis not present

## 2016-07-17 DIAGNOSIS — D500 Iron deficiency anemia secondary to blood loss (chronic): Secondary | ICD-10-CM | POA: Diagnosis not present

## 2016-07-17 DIAGNOSIS — D649 Anemia, unspecified: Secondary | ICD-10-CM | POA: Diagnosis not present

## 2016-07-22 DIAGNOSIS — D582 Other hemoglobinopathies: Secondary | ICD-10-CM | POA: Diagnosis not present

## 2016-07-22 DIAGNOSIS — D500 Iron deficiency anemia secondary to blood loss (chronic): Secondary | ICD-10-CM | POA: Diagnosis not present

## 2016-07-24 DIAGNOSIS — D649 Anemia, unspecified: Secondary | ICD-10-CM | POA: Diagnosis not present

## 2016-07-30 DIAGNOSIS — Z1231 Encounter for screening mammogram for malignant neoplasm of breast: Secondary | ICD-10-CM | POA: Diagnosis not present

## 2016-08-09 DIAGNOSIS — Z79899 Other long term (current) drug therapy: Secondary | ICD-10-CM | POA: Diagnosis not present

## 2016-08-09 DIAGNOSIS — M25562 Pain in left knee: Secondary | ICD-10-CM | POA: Diagnosis not present

## 2016-08-09 DIAGNOSIS — Z5181 Encounter for therapeutic drug level monitoring: Secondary | ICD-10-CM | POA: Diagnosis not present

## 2016-08-09 DIAGNOSIS — G894 Chronic pain syndrome: Secondary | ICD-10-CM | POA: Diagnosis not present

## 2016-08-12 DIAGNOSIS — R040 Epistaxis: Secondary | ICD-10-CM | POA: Diagnosis not present

## 2016-08-12 DIAGNOSIS — I780 Hereditary hemorrhagic telangiectasia: Secondary | ICD-10-CM | POA: Diagnosis not present

## 2016-08-12 DIAGNOSIS — D500 Iron deficiency anemia secondary to blood loss (chronic): Secondary | ICD-10-CM | POA: Diagnosis not present

## 2016-08-14 DIAGNOSIS — M25562 Pain in left knee: Secondary | ICD-10-CM | POA: Diagnosis not present

## 2016-08-14 DIAGNOSIS — M1712 Unilateral primary osteoarthritis, left knee: Secondary | ICD-10-CM | POA: Diagnosis not present

## 2016-08-14 DIAGNOSIS — M11262 Other chondrocalcinosis, left knee: Secondary | ICD-10-CM | POA: Diagnosis not present

## 2016-08-18 DIAGNOSIS — K219 Gastro-esophageal reflux disease without esophagitis: Secondary | ICD-10-CM | POA: Diagnosis not present

## 2016-08-18 DIAGNOSIS — R600 Localized edema: Secondary | ICD-10-CM

## 2016-08-18 DIAGNOSIS — G4700 Insomnia, unspecified: Secondary | ICD-10-CM | POA: Diagnosis not present

## 2016-08-18 DIAGNOSIS — R040 Epistaxis: Secondary | ICD-10-CM | POA: Diagnosis not present

## 2016-08-18 DIAGNOSIS — D62 Acute posthemorrhagic anemia: Secondary | ICD-10-CM | POA: Diagnosis not present

## 2016-08-18 DIAGNOSIS — E441 Mild protein-calorie malnutrition: Secondary | ICD-10-CM

## 2016-08-18 DIAGNOSIS — D500 Iron deficiency anemia secondary to blood loss (chronic): Secondary | ICD-10-CM | POA: Diagnosis not present

## 2016-08-19 DIAGNOSIS — E039 Hypothyroidism, unspecified: Secondary | ICD-10-CM | POA: Diagnosis not present

## 2016-08-19 DIAGNOSIS — E441 Mild protein-calorie malnutrition: Secondary | ICD-10-CM | POA: Diagnosis not present

## 2016-08-19 DIAGNOSIS — K219 Gastro-esophageal reflux disease without esophagitis: Secondary | ICD-10-CM | POA: Diagnosis not present

## 2016-08-19 DIAGNOSIS — R040 Epistaxis: Secondary | ICD-10-CM | POA: Diagnosis not present

## 2016-08-19 DIAGNOSIS — R600 Localized edema: Secondary | ICD-10-CM | POA: Diagnosis not present

## 2016-08-19 DIAGNOSIS — R928 Other abnormal and inconclusive findings on diagnostic imaging of breast: Secondary | ICD-10-CM | POA: Diagnosis not present

## 2016-08-19 DIAGNOSIS — D500 Iron deficiency anemia secondary to blood loss (chronic): Secondary | ICD-10-CM | POA: Diagnosis not present

## 2016-08-19 DIAGNOSIS — D62 Acute posthemorrhagic anemia: Secondary | ICD-10-CM | POA: Diagnosis not present

## 2016-08-19 DIAGNOSIS — Z79899 Other long term (current) drug therapy: Secondary | ICD-10-CM | POA: Diagnosis not present

## 2016-08-19 DIAGNOSIS — I780 Hereditary hemorrhagic telangiectasia: Secondary | ICD-10-CM | POA: Diagnosis not present

## 2016-08-19 DIAGNOSIS — G4700 Insomnia, unspecified: Secondary | ICD-10-CM | POA: Diagnosis not present

## 2016-08-20 DIAGNOSIS — R928 Other abnormal and inconclusive findings on diagnostic imaging of breast: Secondary | ICD-10-CM | POA: Diagnosis not present

## 2016-08-23 DIAGNOSIS — D649 Anemia, unspecified: Secondary | ICD-10-CM | POA: Diagnosis not present

## 2016-08-30 DIAGNOSIS — I780 Hereditary hemorrhagic telangiectasia: Secondary | ICD-10-CM | POA: Diagnosis not present

## 2016-08-30 DIAGNOSIS — R040 Epistaxis: Secondary | ICD-10-CM | POA: Diagnosis not present

## 2016-09-02 DIAGNOSIS — I780 Hereditary hemorrhagic telangiectasia: Secondary | ICD-10-CM | POA: Diagnosis not present

## 2016-09-02 DIAGNOSIS — R040 Epistaxis: Secondary | ICD-10-CM | POA: Diagnosis not present

## 2016-09-05 DIAGNOSIS — K219 Gastro-esophageal reflux disease without esophagitis: Secondary | ICD-10-CM | POA: Diagnosis not present

## 2016-09-05 DIAGNOSIS — R768 Other specified abnormal immunological findings in serum: Secondary | ICD-10-CM | POA: Diagnosis not present

## 2016-09-05 DIAGNOSIS — E210 Primary hyperparathyroidism: Secondary | ICD-10-CM | POA: Diagnosis not present

## 2016-09-05 DIAGNOSIS — I780 Hereditary hemorrhagic telangiectasia: Secondary | ICD-10-CM | POA: Diagnosis not present

## 2016-09-05 DIAGNOSIS — R040 Epistaxis: Secondary | ICD-10-CM | POA: Diagnosis not present

## 2016-09-06 DIAGNOSIS — R768 Other specified abnormal immunological findings in serum: Secondary | ICD-10-CM | POA: Diagnosis not present

## 2016-09-09 DIAGNOSIS — I4891 Unspecified atrial fibrillation: Secondary | ICD-10-CM | POA: Diagnosis not present

## 2016-09-09 DIAGNOSIS — R0609 Other forms of dyspnea: Secondary | ICD-10-CM | POA: Diagnosis not present

## 2016-09-16 DIAGNOSIS — I780 Hereditary hemorrhagic telangiectasia: Secondary | ICD-10-CM | POA: Diagnosis not present

## 2016-09-16 DIAGNOSIS — D500 Iron deficiency anemia secondary to blood loss (chronic): Secondary | ICD-10-CM | POA: Diagnosis not present

## 2016-09-16 DIAGNOSIS — Z87891 Personal history of nicotine dependence: Secondary | ICD-10-CM | POA: Diagnosis not present

## 2016-09-17 DIAGNOSIS — D500 Iron deficiency anemia secondary to blood loss (chronic): Secondary | ICD-10-CM | POA: Diagnosis not present

## 2016-09-21 DIAGNOSIS — Z87891 Personal history of nicotine dependence: Secondary | ICD-10-CM | POA: Diagnosis not present

## 2016-09-21 DIAGNOSIS — D500 Iron deficiency anemia secondary to blood loss (chronic): Secondary | ICD-10-CM | POA: Diagnosis not present

## 2016-09-21 DIAGNOSIS — R040 Epistaxis: Secondary | ICD-10-CM | POA: Diagnosis not present

## 2016-09-21 DIAGNOSIS — I781 Nevus, non-neoplastic: Secondary | ICD-10-CM | POA: Diagnosis not present

## 2016-09-21 DIAGNOSIS — I780 Hereditary hemorrhagic telangiectasia: Secondary | ICD-10-CM | POA: Diagnosis not present

## 2016-09-21 DIAGNOSIS — L299 Pruritus, unspecified: Secondary | ICD-10-CM | POA: Diagnosis not present

## 2016-09-21 DIAGNOSIS — I38 Endocarditis, valve unspecified: Secondary | ICD-10-CM | POA: Diagnosis not present

## 2016-09-21 DIAGNOSIS — R011 Cardiac murmur, unspecified: Secondary | ICD-10-CM | POA: Diagnosis not present

## 2016-09-21 DIAGNOSIS — R51 Headache: Secondary | ICD-10-CM | POA: Diagnosis not present

## 2016-09-21 DIAGNOSIS — D649 Anemia, unspecified: Secondary | ICD-10-CM | POA: Diagnosis not present

## 2016-09-21 DIAGNOSIS — R9431 Abnormal electrocardiogram [ECG] [EKG]: Secondary | ICD-10-CM | POA: Diagnosis not present

## 2016-09-21 DIAGNOSIS — R58 Hemorrhage, not elsewhere classified: Secondary | ICD-10-CM | POA: Diagnosis not present

## 2016-09-21 DIAGNOSIS — I4891 Unspecified atrial fibrillation: Secondary | ICD-10-CM | POA: Diagnosis not present

## 2016-09-24 DIAGNOSIS — I780 Hereditary hemorrhagic telangiectasia: Secondary | ICD-10-CM | POA: Diagnosis not present

## 2016-09-24 DIAGNOSIS — R040 Epistaxis: Secondary | ICD-10-CM | POA: Diagnosis not present

## 2016-10-01 DIAGNOSIS — Z5181 Encounter for therapeutic drug level monitoring: Secondary | ICD-10-CM | POA: Diagnosis not present

## 2016-10-01 DIAGNOSIS — F419 Anxiety disorder, unspecified: Secondary | ICD-10-CM | POA: Diagnosis not present

## 2016-10-01 DIAGNOSIS — L299 Pruritus, unspecified: Secondary | ICD-10-CM | POA: Diagnosis not present

## 2016-10-01 DIAGNOSIS — G894 Chronic pain syndrome: Secondary | ICD-10-CM | POA: Diagnosis not present

## 2016-10-01 DIAGNOSIS — Z79899 Other long term (current) drug therapy: Secondary | ICD-10-CM | POA: Diagnosis not present

## 2016-10-02 DIAGNOSIS — D509 Iron deficiency anemia, unspecified: Secondary | ICD-10-CM | POA: Diagnosis not present

## 2016-10-02 DIAGNOSIS — E210 Primary hyperparathyroidism: Secondary | ICD-10-CM | POA: Diagnosis not present

## 2016-10-02 DIAGNOSIS — I780 Hereditary hemorrhagic telangiectasia: Secondary | ICD-10-CM | POA: Diagnosis not present

## 2016-10-02 DIAGNOSIS — J3489 Other specified disorders of nose and nasal sinuses: Secondary | ICD-10-CM | POA: Diagnosis not present

## 2016-10-02 DIAGNOSIS — J328 Other chronic sinusitis: Secondary | ICD-10-CM | POA: Diagnosis not present

## 2016-10-02 DIAGNOSIS — J343 Hypertrophy of nasal turbinates: Secondary | ICD-10-CM | POA: Diagnosis not present

## 2016-10-02 DIAGNOSIS — F419 Anxiety disorder, unspecified: Secondary | ICD-10-CM | POA: Diagnosis not present

## 2016-10-02 DIAGNOSIS — I4891 Unspecified atrial fibrillation: Secondary | ICD-10-CM | POA: Diagnosis not present

## 2016-10-02 DIAGNOSIS — K219 Gastro-esophageal reflux disease without esophagitis: Secondary | ICD-10-CM | POA: Diagnosis not present

## 2016-10-02 DIAGNOSIS — R768 Other specified abnormal immunological findings in serum: Secondary | ICD-10-CM | POA: Diagnosis not present

## 2016-10-02 DIAGNOSIS — R040 Epistaxis: Secondary | ICD-10-CM | POA: Diagnosis not present

## 2016-10-02 DIAGNOSIS — Z87891 Personal history of nicotine dependence: Secondary | ICD-10-CM | POA: Diagnosis not present

## 2016-10-02 DIAGNOSIS — Z720 Tobacco use: Secondary | ICD-10-CM | POA: Diagnosis not present

## 2016-10-02 DIAGNOSIS — I517 Cardiomegaly: Secondary | ICD-10-CM | POA: Diagnosis not present

## 2016-10-04 DIAGNOSIS — I780 Hereditary hemorrhagic telangiectasia: Secondary | ICD-10-CM | POA: Diagnosis not present

## 2016-10-15 DIAGNOSIS — R040 Epistaxis: Secondary | ICD-10-CM | POA: Diagnosis not present

## 2016-10-15 DIAGNOSIS — I780 Hereditary hemorrhagic telangiectasia: Secondary | ICD-10-CM | POA: Diagnosis not present

## 2016-10-15 DIAGNOSIS — J310 Chronic rhinitis: Secondary | ICD-10-CM | POA: Diagnosis not present

## 2016-10-15 DIAGNOSIS — D500 Iron deficiency anemia secondary to blood loss (chronic): Secondary | ICD-10-CM | POA: Diagnosis not present

## 2016-10-15 DIAGNOSIS — J3489 Other specified disorders of nose and nasal sinuses: Secondary | ICD-10-CM | POA: Diagnosis not present

## 2016-10-23 DIAGNOSIS — D500 Iron deficiency anemia secondary to blood loss (chronic): Secondary | ICD-10-CM | POA: Diagnosis not present

## 2016-10-28 DIAGNOSIS — Z9189 Other specified personal risk factors, not elsewhere classified: Secondary | ICD-10-CM | POA: Diagnosis not present

## 2016-10-28 DIAGNOSIS — Z8679 Personal history of other diseases of the circulatory system: Secondary | ICD-10-CM | POA: Diagnosis not present

## 2016-10-30 DIAGNOSIS — D649 Anemia, unspecified: Secondary | ICD-10-CM | POA: Diagnosis not present

## 2016-10-30 DIAGNOSIS — Z79899 Other long term (current) drug therapy: Secondary | ICD-10-CM | POA: Diagnosis not present

## 2016-10-30 DIAGNOSIS — G894 Chronic pain syndrome: Secondary | ICD-10-CM | POA: Diagnosis not present

## 2016-10-30 DIAGNOSIS — Z5181 Encounter for therapeutic drug level monitoring: Secondary | ICD-10-CM | POA: Diagnosis not present

## 2016-11-04 DIAGNOSIS — I780 Hereditary hemorrhagic telangiectasia: Secondary | ICD-10-CM | POA: Diagnosis not present

## 2016-11-04 DIAGNOSIS — D501 Sideropenic dysphagia: Secondary | ICD-10-CM | POA: Diagnosis not present

## 2016-11-12 DIAGNOSIS — I780 Hereditary hemorrhagic telangiectasia: Secondary | ICD-10-CM | POA: Diagnosis not present

## 2016-11-12 DIAGNOSIS — R040 Epistaxis: Secondary | ICD-10-CM | POA: Diagnosis not present

## 2016-12-13 DIAGNOSIS — Z5181 Encounter for therapeutic drug level monitoring: Secondary | ICD-10-CM | POA: Diagnosis not present

## 2016-12-13 DIAGNOSIS — G4700 Insomnia, unspecified: Secondary | ICD-10-CM | POA: Diagnosis not present

## 2016-12-13 DIAGNOSIS — G8929 Other chronic pain: Secondary | ICD-10-CM | POA: Diagnosis not present

## 2016-12-13 DIAGNOSIS — D649 Anemia, unspecified: Secondary | ICD-10-CM | POA: Diagnosis not present

## 2016-12-13 DIAGNOSIS — Z79899 Other long term (current) drug therapy: Secondary | ICD-10-CM | POA: Diagnosis not present

## 2016-12-19 DIAGNOSIS — I780 Hereditary hemorrhagic telangiectasia: Secondary | ICD-10-CM | POA: Diagnosis not present

## 2016-12-19 DIAGNOSIS — I4891 Unspecified atrial fibrillation: Secondary | ICD-10-CM | POA: Diagnosis not present

## 2016-12-20 DIAGNOSIS — Z87891 Personal history of nicotine dependence: Secondary | ICD-10-CM | POA: Diagnosis not present

## 2016-12-20 DIAGNOSIS — J329 Chronic sinusitis, unspecified: Secondary | ICD-10-CM | POA: Diagnosis not present

## 2016-12-20 DIAGNOSIS — Z79899 Other long term (current) drug therapy: Secondary | ICD-10-CM | POA: Diagnosis not present

## 2016-12-20 DIAGNOSIS — I780 Hereditary hemorrhagic telangiectasia: Secondary | ICD-10-CM | POA: Diagnosis not present

## 2016-12-20 DIAGNOSIS — R768 Other specified abnormal immunological findings in serum: Secondary | ICD-10-CM | POA: Diagnosis not present

## 2016-12-20 DIAGNOSIS — R040 Epistaxis: Secondary | ICD-10-CM | POA: Diagnosis not present

## 2016-12-21 DIAGNOSIS — Z87891 Personal history of nicotine dependence: Secondary | ICD-10-CM | POA: Diagnosis not present

## 2016-12-21 DIAGNOSIS — Z79899 Other long term (current) drug therapy: Secondary | ICD-10-CM | POA: Diagnosis not present

## 2016-12-21 DIAGNOSIS — I780 Hereditary hemorrhagic telangiectasia: Secondary | ICD-10-CM | POA: Diagnosis not present

## 2016-12-21 DIAGNOSIS — J329 Chronic sinusitis, unspecified: Secondary | ICD-10-CM | POA: Diagnosis not present

## 2017-01-06 DIAGNOSIS — R040 Epistaxis: Secondary | ICD-10-CM | POA: Diagnosis not present

## 2017-01-06 DIAGNOSIS — J3489 Other specified disorders of nose and nasal sinuses: Secondary | ICD-10-CM | POA: Diagnosis not present

## 2017-01-17 DIAGNOSIS — F411 Generalized anxiety disorder: Secondary | ICD-10-CM | POA: Diagnosis not present

## 2017-01-17 DIAGNOSIS — Z5181 Encounter for therapeutic drug level monitoring: Secondary | ICD-10-CM | POA: Diagnosis not present

## 2017-01-17 DIAGNOSIS — Z79899 Other long term (current) drug therapy: Secondary | ICD-10-CM | POA: Diagnosis not present

## 2017-01-17 DIAGNOSIS — G894 Chronic pain syndrome: Secondary | ICD-10-CM | POA: Diagnosis not present

## 2017-01-31 DIAGNOSIS — S40862A Insect bite (nonvenomous) of left upper arm, initial encounter: Secondary | ICD-10-CM | POA: Diagnosis not present

## 2017-01-31 DIAGNOSIS — W57XXXA Bitten or stung by nonvenomous insect and other nonvenomous arthropods, initial encounter: Secondary | ICD-10-CM | POA: Diagnosis not present

## 2017-01-31 DIAGNOSIS — S40861A Insect bite (nonvenomous) of right upper arm, initial encounter: Secondary | ICD-10-CM | POA: Diagnosis not present

## 2017-02-17 DIAGNOSIS — J3489 Other specified disorders of nose and nasal sinuses: Secondary | ICD-10-CM | POA: Diagnosis not present

## 2017-02-17 DIAGNOSIS — R040 Epistaxis: Secondary | ICD-10-CM | POA: Diagnosis not present

## 2017-02-20 DIAGNOSIS — S40861A Insect bite (nonvenomous) of right upper arm, initial encounter: Secondary | ICD-10-CM | POA: Diagnosis not present

## 2017-02-20 DIAGNOSIS — W57XXXA Bitten or stung by nonvenomous insect and other nonvenomous arthropods, initial encounter: Secondary | ICD-10-CM | POA: Diagnosis not present

## 2017-03-11 DIAGNOSIS — R21 Rash and other nonspecific skin eruption: Secondary | ICD-10-CM | POA: Diagnosis not present

## 2017-03-11 DIAGNOSIS — M79605 Pain in left leg: Secondary | ICD-10-CM | POA: Diagnosis not present

## 2017-03-18 DIAGNOSIS — L501 Idiopathic urticaria: Secondary | ICD-10-CM | POA: Diagnosis not present

## 2017-04-17 DIAGNOSIS — F411 Generalized anxiety disorder: Secondary | ICD-10-CM | POA: Diagnosis not present

## 2017-04-17 DIAGNOSIS — D649 Anemia, unspecified: Secondary | ICD-10-CM | POA: Diagnosis not present

## 2017-05-20 DIAGNOSIS — R040 Epistaxis: Secondary | ICD-10-CM | POA: Diagnosis not present

## 2017-05-20 DIAGNOSIS — J3489 Other specified disorders of nose and nasal sinuses: Secondary | ICD-10-CM | POA: Diagnosis not present

## 2017-05-26 DIAGNOSIS — Z5181 Encounter for therapeutic drug level monitoring: Secondary | ICD-10-CM | POA: Diagnosis not present

## 2017-05-26 DIAGNOSIS — Z79899 Other long term (current) drug therapy: Secondary | ICD-10-CM | POA: Diagnosis not present

## 2017-05-26 DIAGNOSIS — G894 Chronic pain syndrome: Secondary | ICD-10-CM | POA: Diagnosis not present

## 2017-06-13 DIAGNOSIS — M79605 Pain in left leg: Secondary | ICD-10-CM | POA: Diagnosis not present

## 2017-06-13 DIAGNOSIS — M25562 Pain in left knee: Secondary | ICD-10-CM | POA: Diagnosis not present

## 2017-06-13 DIAGNOSIS — M7989 Other specified soft tissue disorders: Secondary | ICD-10-CM | POA: Diagnosis not present

## 2017-06-13 DIAGNOSIS — R600 Localized edema: Secondary | ICD-10-CM | POA: Diagnosis not present

## 2017-06-13 DIAGNOSIS — M79662 Pain in left lower leg: Secondary | ICD-10-CM | POA: Diagnosis not present

## 2017-06-23 DIAGNOSIS — H25813 Combined forms of age-related cataract, bilateral: Secondary | ICD-10-CM | POA: Diagnosis not present

## 2017-06-23 DIAGNOSIS — H18413 Arcus senilis, bilateral: Secondary | ICD-10-CM | POA: Diagnosis not present

## 2017-06-30 ENCOUNTER — Ambulatory Visit: Payer: Medicare HMO | Admitting: Podiatry

## 2017-06-30 DIAGNOSIS — Q828 Other specified congenital malformations of skin: Secondary | ICD-10-CM

## 2017-07-24 NOTE — Progress Notes (Signed)
  Subjective:  Patient ID: Jennifer Holden, female    DOB: 1941/02/18,  MRN: [id number]  Chief Complaint  Patient presents with  . Foot Problem    is having pain in her heels again around lesions says the pads he put in her shoes have worn out    77 y.o. female returns for the above complaint.  Reports pain in both heels and states that she put pads in her shoes and pads were out.  Objective:  There were no vitals filed for this visit. General AA&O x3. Normal mood and affect.  Vascular Pedal pulses palpable.  Neurologic Epicritic sensation grossly intact.  Dermatologic No open lesions. Skin normal texture and turgor.  Orthopedic:  Porokeratosis presents with bilateral heel   Assessment & Plan:  Patient was evaluated and treated and all questions answered.  Porokeratosis -Educated on self-care.  Advised that he did not have the necessary material to adequately pad off her heels by Dr. Blenda Mounts was able to do. -Lesion gently debrided.  Bleeding noted which was cauterized with silver nitrate with control of bleeding.  No Follow-up on file.

## 2017-08-01 DIAGNOSIS — G894 Chronic pain syndrome: Secondary | ICD-10-CM | POA: Diagnosis not present

## 2017-08-01 DIAGNOSIS — Z5181 Encounter for therapeutic drug level monitoring: Secondary | ICD-10-CM | POA: Diagnosis not present

## 2017-08-01 DIAGNOSIS — H25812 Combined forms of age-related cataract, left eye: Secondary | ICD-10-CM | POA: Diagnosis not present

## 2017-08-01 DIAGNOSIS — Z79899 Other long term (current) drug therapy: Secondary | ICD-10-CM | POA: Diagnosis not present

## 2017-08-19 DIAGNOSIS — H259 Unspecified age-related cataract: Secondary | ICD-10-CM | POA: Diagnosis not present

## 2017-08-19 DIAGNOSIS — F419 Anxiety disorder, unspecified: Secondary | ICD-10-CM | POA: Diagnosis not present

## 2017-08-19 DIAGNOSIS — I10 Essential (primary) hypertension: Secondary | ICD-10-CM | POA: Diagnosis not present

## 2017-08-19 DIAGNOSIS — H40013 Open angle with borderline findings, low risk, bilateral: Secondary | ICD-10-CM | POA: Diagnosis not present

## 2017-08-19 DIAGNOSIS — H25812 Combined forms of age-related cataract, left eye: Secondary | ICD-10-CM | POA: Diagnosis not present

## 2017-08-19 DIAGNOSIS — D649 Anemia, unspecified: Secondary | ICD-10-CM | POA: Diagnosis not present

## 2017-08-19 DIAGNOSIS — M5136 Other intervertebral disc degeneration, lumbar region: Secondary | ICD-10-CM | POA: Diagnosis not present

## 2017-08-19 DIAGNOSIS — H52223 Regular astigmatism, bilateral: Secondary | ICD-10-CM | POA: Diagnosis not present

## 2017-08-19 DIAGNOSIS — H5015 Alternating exotropia: Secondary | ICD-10-CM | POA: Diagnosis not present

## 2017-08-19 DIAGNOSIS — K219 Gastro-esophageal reflux disease without esophagitis: Secondary | ICD-10-CM | POA: Diagnosis not present

## 2017-09-10 DIAGNOSIS — M1712 Unilateral primary osteoarthritis, left knee: Secondary | ICD-10-CM | POA: Diagnosis not present

## 2017-09-16 DIAGNOSIS — H25811 Combined forms of age-related cataract, right eye: Secondary | ICD-10-CM | POA: Diagnosis not present

## 2017-09-16 DIAGNOSIS — F419 Anxiety disorder, unspecified: Secondary | ICD-10-CM | POA: Diagnosis not present

## 2017-09-16 DIAGNOSIS — H259 Unspecified age-related cataract: Secondary | ICD-10-CM | POA: Diagnosis not present

## 2017-09-16 DIAGNOSIS — Z79899 Other long term (current) drug therapy: Secondary | ICD-10-CM | POA: Diagnosis not present

## 2017-09-16 DIAGNOSIS — H2511 Age-related nuclear cataract, right eye: Secondary | ICD-10-CM | POA: Diagnosis not present

## 2017-09-16 DIAGNOSIS — F17220 Nicotine dependence, chewing tobacco, uncomplicated: Secondary | ICD-10-CM | POA: Diagnosis not present

## 2017-09-16 DIAGNOSIS — I10 Essential (primary) hypertension: Secondary | ICD-10-CM | POA: Diagnosis not present

## 2017-09-16 DIAGNOSIS — K219 Gastro-esophageal reflux disease without esophagitis: Secondary | ICD-10-CM | POA: Diagnosis not present

## 2017-09-16 DIAGNOSIS — D649 Anemia, unspecified: Secondary | ICD-10-CM | POA: Diagnosis not present

## 2017-09-16 DIAGNOSIS — M5136 Other intervertebral disc degeneration, lumbar region: Secondary | ICD-10-CM | POA: Diagnosis not present

## 2017-09-19 DIAGNOSIS — M1712 Unilateral primary osteoarthritis, left knee: Secondary | ICD-10-CM | POA: Diagnosis not present

## 2017-09-23 DIAGNOSIS — J3489 Other specified disorders of nose and nasal sinuses: Secondary | ICD-10-CM | POA: Diagnosis not present

## 2017-09-23 DIAGNOSIS — R040 Epistaxis: Secondary | ICD-10-CM | POA: Diagnosis not present

## 2017-10-07 DIAGNOSIS — M1712 Unilateral primary osteoarthritis, left knee: Secondary | ICD-10-CM | POA: Diagnosis not present

## 2017-10-13 DIAGNOSIS — Z961 Presence of intraocular lens: Secondary | ICD-10-CM | POA: Diagnosis not present

## 2017-10-28 DIAGNOSIS — G894 Chronic pain syndrome: Secondary | ICD-10-CM | POA: Diagnosis not present

## 2017-10-28 DIAGNOSIS — Z79899 Other long term (current) drug therapy: Secondary | ICD-10-CM | POA: Diagnosis not present

## 2017-10-28 DIAGNOSIS — Z5181 Encounter for therapeutic drug level monitoring: Secondary | ICD-10-CM | POA: Diagnosis not present

## 2017-11-11 DIAGNOSIS — Z961 Presence of intraocular lens: Secondary | ICD-10-CM | POA: Diagnosis not present

## 2017-12-12 DIAGNOSIS — G894 Chronic pain syndrome: Secondary | ICD-10-CM | POA: Diagnosis not present

## 2017-12-12 DIAGNOSIS — Z5181 Encounter for therapeutic drug level monitoring: Secondary | ICD-10-CM | POA: Diagnosis not present

## 2017-12-12 DIAGNOSIS — Z79899 Other long term (current) drug therapy: Secondary | ICD-10-CM | POA: Diagnosis not present

## 2018-01-27 DIAGNOSIS — M1712 Unilateral primary osteoarthritis, left knee: Secondary | ICD-10-CM | POA: Diagnosis not present

## 2018-02-04 DIAGNOSIS — Z5181 Encounter for therapeutic drug level monitoring: Secondary | ICD-10-CM | POA: Diagnosis not present

## 2018-02-04 DIAGNOSIS — M810 Age-related osteoporosis without current pathological fracture: Secondary | ICD-10-CM | POA: Diagnosis not present

## 2018-02-04 DIAGNOSIS — E7800 Pure hypercholesterolemia, unspecified: Secondary | ICD-10-CM | POA: Diagnosis not present

## 2018-02-04 DIAGNOSIS — I10 Essential (primary) hypertension: Secondary | ICD-10-CM | POA: Diagnosis not present

## 2018-02-04 DIAGNOSIS — Z0000 Encounter for general adult medical examination without abnormal findings: Secondary | ICD-10-CM | POA: Diagnosis not present

## 2018-02-04 DIAGNOSIS — E559 Vitamin D deficiency, unspecified: Secondary | ICD-10-CM | POA: Diagnosis not present

## 2018-02-04 DIAGNOSIS — Z1389 Encounter for screening for other disorder: Secondary | ICD-10-CM | POA: Diagnosis not present

## 2018-02-04 DIAGNOSIS — D509 Iron deficiency anemia, unspecified: Secondary | ICD-10-CM | POA: Diagnosis not present

## 2018-02-04 DIAGNOSIS — Z79899 Other long term (current) drug therapy: Secondary | ICD-10-CM | POA: Diagnosis not present

## 2018-02-04 DIAGNOSIS — G894 Chronic pain syndrome: Secondary | ICD-10-CM | POA: Diagnosis not present

## 2018-02-04 DIAGNOSIS — Z8639 Personal history of other endocrine, nutritional and metabolic disease: Secondary | ICD-10-CM | POA: Diagnosis not present

## 2018-02-04 DIAGNOSIS — I780 Hereditary hemorrhagic telangiectasia: Secondary | ICD-10-CM | POA: Diagnosis not present

## 2018-03-10 DIAGNOSIS — R042 Hemoptysis: Secondary | ICD-10-CM | POA: Diagnosis not present

## 2018-03-10 DIAGNOSIS — R040 Epistaxis: Secondary | ICD-10-CM | POA: Diagnosis not present

## 2018-03-10 DIAGNOSIS — I780 Hereditary hemorrhagic telangiectasia: Secondary | ICD-10-CM | POA: Diagnosis not present

## 2018-03-20 DIAGNOSIS — K219 Gastro-esophageal reflux disease without esophagitis: Secondary | ICD-10-CM | POA: Diagnosis not present

## 2018-03-20 DIAGNOSIS — Z79899 Other long term (current) drug therapy: Secondary | ICD-10-CM | POA: Diagnosis not present

## 2018-03-20 DIAGNOSIS — G894 Chronic pain syndrome: Secondary | ICD-10-CM | POA: Diagnosis not present

## 2018-03-20 DIAGNOSIS — Z5181 Encounter for therapeutic drug level monitoring: Secondary | ICD-10-CM | POA: Diagnosis not present

## 2018-04-20 DIAGNOSIS — Z5181 Encounter for therapeutic drug level monitoring: Secondary | ICD-10-CM | POA: Diagnosis not present

## 2018-04-20 DIAGNOSIS — Z79899 Other long term (current) drug therapy: Secondary | ICD-10-CM | POA: Diagnosis not present

## 2018-04-20 DIAGNOSIS — G894 Chronic pain syndrome: Secondary | ICD-10-CM | POA: Diagnosis not present

## 2018-04-20 DIAGNOSIS — Z0289 Encounter for other administrative examinations: Secondary | ICD-10-CM | POA: Diagnosis not present

## 2018-05-12 DIAGNOSIS — M1712 Unilateral primary osteoarthritis, left knee: Secondary | ICD-10-CM | POA: Diagnosis not present

## 2018-05-26 DIAGNOSIS — R040 Epistaxis: Secondary | ICD-10-CM | POA: Diagnosis not present

## 2018-05-26 DIAGNOSIS — I780 Hereditary hemorrhagic telangiectasia: Secondary | ICD-10-CM | POA: Diagnosis not present

## 2018-05-26 DIAGNOSIS — R042 Hemoptysis: Secondary | ICD-10-CM | POA: Diagnosis not present

## 2018-05-26 DIAGNOSIS — J0140 Acute pansinusitis, unspecified: Secondary | ICD-10-CM | POA: Diagnosis not present

## 2018-05-26 DIAGNOSIS — J3489 Other specified disorders of nose and nasal sinuses: Secondary | ICD-10-CM | POA: Diagnosis not present

## 2018-06-01 DIAGNOSIS — Z5181 Encounter for therapeutic drug level monitoring: Secondary | ICD-10-CM | POA: Diagnosis not present

## 2018-06-01 DIAGNOSIS — Z79899 Other long term (current) drug therapy: Secondary | ICD-10-CM | POA: Diagnosis not present

## 2018-06-01 DIAGNOSIS — G894 Chronic pain syndrome: Secondary | ICD-10-CM | POA: Diagnosis not present

## 2018-06-17 DIAGNOSIS — S99921A Unspecified injury of right foot, initial encounter: Secondary | ICD-10-CM | POA: Diagnosis not present

## 2018-06-17 DIAGNOSIS — S8991XA Unspecified injury of right lower leg, initial encounter: Secondary | ICD-10-CM | POA: Diagnosis not present

## 2018-06-17 DIAGNOSIS — S92901A Unspecified fracture of right foot, initial encounter for closed fracture: Secondary | ICD-10-CM | POA: Diagnosis not present

## 2018-06-17 DIAGNOSIS — M79671 Pain in right foot: Secondary | ICD-10-CM | POA: Diagnosis not present

## 2018-06-17 DIAGNOSIS — M25561 Pain in right knee: Secondary | ICD-10-CM | POA: Diagnosis not present

## 2018-06-17 DIAGNOSIS — S8391XA Sprain of unspecified site of right knee, initial encounter: Secondary | ICD-10-CM | POA: Diagnosis not present

## 2018-06-23 DIAGNOSIS — S92919A Unspecified fracture of unspecified toe(s), initial encounter for closed fracture: Secondary | ICD-10-CM | POA: Diagnosis not present

## 2018-07-13 DIAGNOSIS — Z5181 Encounter for therapeutic drug level monitoring: Secondary | ICD-10-CM | POA: Diagnosis not present

## 2018-07-13 DIAGNOSIS — D649 Anemia, unspecified: Secondary | ICD-10-CM | POA: Diagnosis not present

## 2018-07-13 DIAGNOSIS — Z79899 Other long term (current) drug therapy: Secondary | ICD-10-CM | POA: Diagnosis not present

## 2018-07-13 DIAGNOSIS — G894 Chronic pain syndrome: Secondary | ICD-10-CM | POA: Diagnosis not present

## 2018-08-03 DIAGNOSIS — Z961 Presence of intraocular lens: Secondary | ICD-10-CM | POA: Diagnosis not present

## 2018-08-21 DIAGNOSIS — M79606 Pain in leg, unspecified: Secondary | ICD-10-CM | POA: Diagnosis not present

## 2018-09-04 DIAGNOSIS — I872 Venous insufficiency (chronic) (peripheral): Secondary | ICD-10-CM | POA: Diagnosis not present

## 2018-09-11 DIAGNOSIS — G894 Chronic pain syndrome: Secondary | ICD-10-CM | POA: Diagnosis not present

## 2018-09-25 DIAGNOSIS — M1712 Unilateral primary osteoarthritis, left knee: Secondary | ICD-10-CM | POA: Diagnosis not present

## 2018-10-23 DIAGNOSIS — G894 Chronic pain syndrome: Secondary | ICD-10-CM | POA: Diagnosis not present

## 2018-11-02 DIAGNOSIS — S52502A Unspecified fracture of the lower end of left radius, initial encounter for closed fracture: Secondary | ICD-10-CM | POA: Diagnosis not present

## 2018-11-02 DIAGNOSIS — S79912A Unspecified injury of left hip, initial encounter: Secondary | ICD-10-CM | POA: Diagnosis not present

## 2018-11-02 DIAGNOSIS — M5136 Other intervertebral disc degeneration, lumbar region: Secondary | ICD-10-CM | POA: Diagnosis not present

## 2018-11-02 DIAGNOSIS — S3982XA Other specified injuries of lower back, initial encounter: Secondary | ICD-10-CM | POA: Diagnosis not present

## 2018-11-02 DIAGNOSIS — I10 Essential (primary) hypertension: Secondary | ICD-10-CM | POA: Diagnosis not present

## 2018-11-02 DIAGNOSIS — M25552 Pain in left hip: Secondary | ICD-10-CM | POA: Diagnosis not present

## 2018-11-02 DIAGNOSIS — M1990 Unspecified osteoarthritis, unspecified site: Secondary | ICD-10-CM | POA: Diagnosis not present

## 2018-11-02 DIAGNOSIS — M549 Dorsalgia, unspecified: Secondary | ICD-10-CM | POA: Diagnosis not present

## 2018-11-02 DIAGNOSIS — Z79899 Other long term (current) drug therapy: Secondary | ICD-10-CM | POA: Diagnosis not present

## 2018-11-03 DIAGNOSIS — S5292XA Unspecified fracture of left forearm, initial encounter for closed fracture: Secondary | ICD-10-CM | POA: Diagnosis not present

## 2018-11-06 DIAGNOSIS — S52502A Unspecified fracture of the lower end of left radius, initial encounter for closed fracture: Secondary | ICD-10-CM | POA: Diagnosis not present

## 2018-11-24 DIAGNOSIS — J329 Chronic sinusitis, unspecified: Secondary | ICD-10-CM | POA: Diagnosis not present

## 2018-11-24 DIAGNOSIS — J3489 Other specified disorders of nose and nasal sinuses: Secondary | ICD-10-CM | POA: Diagnosis not present

## 2018-11-24 DIAGNOSIS — I780 Hereditary hemorrhagic telangiectasia: Secondary | ICD-10-CM | POA: Diagnosis not present

## 2018-11-24 DIAGNOSIS — J320 Chronic maxillary sinusitis: Secondary | ICD-10-CM | POA: Diagnosis not present

## 2018-11-27 DIAGNOSIS — S52502A Unspecified fracture of the lower end of left radius, initial encounter for closed fracture: Secondary | ICD-10-CM | POA: Diagnosis not present

## 2018-11-27 DIAGNOSIS — G894 Chronic pain syndrome: Secondary | ICD-10-CM | POA: Diagnosis not present

## 2018-12-14 DIAGNOSIS — G894 Chronic pain syndrome: Secondary | ICD-10-CM | POA: Diagnosis not present

## 2018-12-14 DIAGNOSIS — D649 Anemia, unspecified: Secondary | ICD-10-CM | POA: Diagnosis not present

## 2018-12-22 DIAGNOSIS — S52502A Unspecified fracture of the lower end of left radius, initial encounter for closed fracture: Secondary | ICD-10-CM | POA: Diagnosis not present

## 2018-12-24 DIAGNOSIS — R600 Localized edema: Secondary | ICD-10-CM | POA: Diagnosis not present

## 2018-12-24 DIAGNOSIS — M79605 Pain in left leg: Secondary | ICD-10-CM | POA: Diagnosis not present

## 2019-01-15 DIAGNOSIS — R1032 Left lower quadrant pain: Secondary | ICD-10-CM | POA: Diagnosis not present

## 2019-01-15 DIAGNOSIS — R109 Unspecified abdominal pain: Secondary | ICD-10-CM | POA: Diagnosis not present

## 2019-01-15 DIAGNOSIS — M1712 Unilateral primary osteoarthritis, left knee: Secondary | ICD-10-CM | POA: Diagnosis not present

## 2019-01-25 DIAGNOSIS — K8020 Calculus of gallbladder without cholecystitis without obstruction: Secondary | ICD-10-CM | POA: Diagnosis not present

## 2019-01-25 DIAGNOSIS — K5730 Diverticulosis of large intestine without perforation or abscess without bleeding: Secondary | ICD-10-CM | POA: Diagnosis not present

## 2019-01-25 DIAGNOSIS — R1032 Left lower quadrant pain: Secondary | ICD-10-CM | POA: Diagnosis not present

## 2019-03-03 DIAGNOSIS — G894 Chronic pain syndrome: Secondary | ICD-10-CM | POA: Diagnosis not present

## 2019-03-03 DIAGNOSIS — Z79899 Other long term (current) drug therapy: Secondary | ICD-10-CM | POA: Diagnosis not present

## 2019-03-03 DIAGNOSIS — M7522 Bicipital tendinitis, left shoulder: Secondary | ICD-10-CM | POA: Diagnosis not present

## 2019-03-03 DIAGNOSIS — Z5181 Encounter for therapeutic drug level monitoring: Secondary | ICD-10-CM | POA: Diagnosis not present

## 2019-03-04 DIAGNOSIS — M1712 Unilateral primary osteoarthritis, left knee: Secondary | ICD-10-CM | POA: Diagnosis not present

## 2019-04-19 DIAGNOSIS — L03115 Cellulitis of right lower limb: Secondary | ICD-10-CM | POA: Diagnosis not present

## 2019-04-19 DIAGNOSIS — L02412 Cutaneous abscess of left axilla: Secondary | ICD-10-CM | POA: Diagnosis not present

## 2019-05-11 DIAGNOSIS — R040 Epistaxis: Secondary | ICD-10-CM | POA: Diagnosis not present

## 2019-05-11 DIAGNOSIS — R600 Localized edema: Secondary | ICD-10-CM | POA: Diagnosis not present

## 2019-05-11 DIAGNOSIS — I780 Hereditary hemorrhagic telangiectasia: Secondary | ICD-10-CM | POA: Diagnosis not present

## 2019-05-11 DIAGNOSIS — J3489 Other specified disorders of nose and nasal sinuses: Secondary | ICD-10-CM | POA: Diagnosis not present

## 2019-05-11 DIAGNOSIS — J320 Chronic maxillary sinusitis: Secondary | ICD-10-CM | POA: Diagnosis not present

## 2019-06-03 DIAGNOSIS — M7520 Bicipital tendinitis, unspecified shoulder: Secondary | ICD-10-CM | POA: Diagnosis not present

## 2019-06-03 DIAGNOSIS — Z5181 Encounter for therapeutic drug level monitoring: Secondary | ICD-10-CM | POA: Diagnosis not present

## 2019-06-03 DIAGNOSIS — Z79899 Other long term (current) drug therapy: Secondary | ICD-10-CM | POA: Diagnosis not present

## 2019-06-03 DIAGNOSIS — G894 Chronic pain syndrome: Secondary | ICD-10-CM | POA: Diagnosis not present

## 2019-06-08 DIAGNOSIS — Z8679 Personal history of other diseases of the circulatory system: Secondary | ICD-10-CM | POA: Diagnosis not present

## 2019-06-08 DIAGNOSIS — R040 Epistaxis: Secondary | ICD-10-CM | POA: Diagnosis not present

## 2019-06-08 DIAGNOSIS — I780 Hereditary hemorrhagic telangiectasia: Secondary | ICD-10-CM | POA: Diagnosis not present

## 2019-06-08 DIAGNOSIS — J3489 Other specified disorders of nose and nasal sinuses: Secondary | ICD-10-CM | POA: Diagnosis not present

## 2019-06-08 DIAGNOSIS — R9431 Abnormal electrocardiogram [ECG] [EKG]: Secondary | ICD-10-CM | POA: Diagnosis not present

## 2019-06-08 DIAGNOSIS — J320 Chronic maxillary sinusitis: Secondary | ICD-10-CM | POA: Diagnosis not present

## 2019-06-08 DIAGNOSIS — I4891 Unspecified atrial fibrillation: Secondary | ICD-10-CM | POA: Diagnosis not present

## 2019-06-08 DIAGNOSIS — Z20828 Contact with and (suspected) exposure to other viral communicable diseases: Secondary | ICD-10-CM | POA: Diagnosis not present

## 2019-06-08 DIAGNOSIS — D500 Iron deficiency anemia secondary to blood loss (chronic): Secondary | ICD-10-CM | POA: Diagnosis not present

## 2019-06-11 DIAGNOSIS — J3489 Other specified disorders of nose and nasal sinuses: Secondary | ICD-10-CM | POA: Diagnosis not present

## 2019-06-11 DIAGNOSIS — F419 Anxiety disorder, unspecified: Secondary | ICD-10-CM | POA: Diagnosis not present

## 2019-06-11 DIAGNOSIS — J320 Chronic maxillary sinusitis: Secondary | ICD-10-CM | POA: Diagnosis not present

## 2019-06-11 DIAGNOSIS — E210 Primary hyperparathyroidism: Secondary | ICD-10-CM | POA: Diagnosis not present

## 2019-06-11 DIAGNOSIS — J310 Chronic rhinitis: Secondary | ICD-10-CM | POA: Diagnosis not present

## 2019-06-11 DIAGNOSIS — I4891 Unspecified atrial fibrillation: Secondary | ICD-10-CM | POA: Diagnosis not present

## 2019-06-11 DIAGNOSIS — R040 Epistaxis: Secondary | ICD-10-CM | POA: Diagnosis not present

## 2019-06-11 DIAGNOSIS — M1990 Unspecified osteoarthritis, unspecified site: Secondary | ICD-10-CM | POA: Diagnosis not present

## 2019-06-11 DIAGNOSIS — F17200 Nicotine dependence, unspecified, uncomplicated: Secondary | ICD-10-CM | POA: Diagnosis not present

## 2019-06-11 DIAGNOSIS — I780 Hereditary hemorrhagic telangiectasia: Secondary | ICD-10-CM | POA: Diagnosis not present

## 2019-06-11 DIAGNOSIS — J324 Chronic pansinusitis: Secondary | ICD-10-CM | POA: Diagnosis not present

## 2019-06-22 DIAGNOSIS — H818X3 Other disorders of vestibular function, bilateral: Secondary | ICD-10-CM | POA: Diagnosis not present

## 2019-06-22 DIAGNOSIS — J320 Chronic maxillary sinusitis: Secondary | ICD-10-CM | POA: Diagnosis not present

## 2019-06-22 DIAGNOSIS — Z48813 Encounter for surgical aftercare following surgery on the respiratory system: Secondary | ICD-10-CM | POA: Diagnosis not present

## 2019-06-22 DIAGNOSIS — J3489 Other specified disorders of nose and nasal sinuses: Secondary | ICD-10-CM | POA: Diagnosis not present

## 2019-06-22 DIAGNOSIS — I780 Hereditary hemorrhagic telangiectasia: Secondary | ICD-10-CM | POA: Diagnosis not present

## 2019-06-22 DIAGNOSIS — R040 Epistaxis: Secondary | ICD-10-CM | POA: Diagnosis not present

## 2019-06-29 DIAGNOSIS — J320 Chronic maxillary sinusitis: Secondary | ICD-10-CM | POA: Diagnosis not present

## 2019-06-29 DIAGNOSIS — R040 Epistaxis: Secondary | ICD-10-CM | POA: Diagnosis not present

## 2019-06-29 DIAGNOSIS — J3489 Other specified disorders of nose and nasal sinuses: Secondary | ICD-10-CM | POA: Diagnosis not present

## 2019-06-29 DIAGNOSIS — I780 Hereditary hemorrhagic telangiectasia: Secondary | ICD-10-CM | POA: Diagnosis not present

## 2019-07-02 DIAGNOSIS — M1712 Unilateral primary osteoarthritis, left knee: Secondary | ICD-10-CM | POA: Diagnosis not present

## 2019-07-10 DIAGNOSIS — K5790 Diverticulosis of intestine, part unspecified, without perforation or abscess without bleeding: Secondary | ICD-10-CM | POA: Diagnosis not present

## 2019-07-10 DIAGNOSIS — U071 COVID-19: Secondary | ICD-10-CM | POA: Diagnosis not present

## 2019-07-10 DIAGNOSIS — I700 Atherosclerosis of aorta: Secondary | ICD-10-CM | POA: Diagnosis not present

## 2019-07-10 DIAGNOSIS — R110 Nausea: Secondary | ICD-10-CM | POA: Diagnosis not present

## 2019-07-10 DIAGNOSIS — R197 Diarrhea, unspecified: Secondary | ICD-10-CM | POA: Diagnosis not present

## 2019-07-10 DIAGNOSIS — J1282 Pneumonia due to coronavirus disease 2019: Secondary | ICD-10-CM | POA: Diagnosis not present

## 2019-07-10 DIAGNOSIS — I774 Celiac artery compression syndrome: Secondary | ICD-10-CM | POA: Diagnosis not present

## 2019-07-10 DIAGNOSIS — R918 Other nonspecific abnormal finding of lung field: Secondary | ICD-10-CM | POA: Diagnosis not present

## 2019-07-10 DIAGNOSIS — I482 Chronic atrial fibrillation, unspecified: Secondary | ICD-10-CM | POA: Diagnosis not present

## 2019-07-10 DIAGNOSIS — K5792 Diverticulitis of intestine, part unspecified, without perforation or abscess without bleeding: Secondary | ICD-10-CM | POA: Diagnosis not present

## 2019-07-10 DIAGNOSIS — R1030 Lower abdominal pain, unspecified: Secondary | ICD-10-CM | POA: Diagnosis not present

## 2019-07-10 DIAGNOSIS — I4891 Unspecified atrial fibrillation: Secondary | ICD-10-CM | POA: Diagnosis not present

## 2019-07-11 ENCOUNTER — Inpatient Hospital Stay (HOSPITAL_COMMUNITY): Payer: Medicare HMO

## 2019-07-11 ENCOUNTER — Inpatient Hospital Stay (HOSPITAL_COMMUNITY)
Admission: AD | Admit: 2019-07-11 | Discharge: 2019-07-26 | DRG: 177 | Disposition: E | Payer: Medicare HMO | Source: Other Acute Inpatient Hospital | Attending: Internal Medicine | Admitting: Internal Medicine

## 2019-07-11 ENCOUNTER — Other Ambulatory Visit: Payer: Self-pay

## 2019-07-11 ENCOUNTER — Encounter (HOSPITAL_COMMUNITY): Payer: Self-pay | Admitting: Internal Medicine

## 2019-07-11 DIAGNOSIS — I4891 Unspecified atrial fibrillation: Secondary | ICD-10-CM | POA: Diagnosis present

## 2019-07-11 DIAGNOSIS — J1282 Pneumonia due to coronavirus disease 2019: Secondary | ICD-10-CM | POA: Diagnosis not present

## 2019-07-11 DIAGNOSIS — U071 COVID-19: Secondary | ICD-10-CM | POA: Diagnosis not present

## 2019-07-11 DIAGNOSIS — R110 Nausea: Secondary | ICD-10-CM | POA: Diagnosis not present

## 2019-07-11 DIAGNOSIS — K219 Gastro-esophageal reflux disease without esophagitis: Secondary | ICD-10-CM | POA: Diagnosis present

## 2019-07-11 DIAGNOSIS — J9601 Acute respiratory failure with hypoxia: Secondary | ICD-10-CM | POA: Diagnosis present

## 2019-07-11 DIAGNOSIS — R197 Diarrhea, unspecified: Secondary | ICD-10-CM | POA: Diagnosis not present

## 2019-07-11 DIAGNOSIS — I5033 Acute on chronic diastolic (congestive) heart failure: Secondary | ICD-10-CM | POA: Diagnosis not present

## 2019-07-11 DIAGNOSIS — Z79899 Other long term (current) drug therapy: Secondary | ICD-10-CM | POA: Diagnosis not present

## 2019-07-11 DIAGNOSIS — I110 Hypertensive heart disease with heart failure: Secondary | ICD-10-CM | POA: Diagnosis present

## 2019-07-11 DIAGNOSIS — Z515 Encounter for palliative care: Secondary | ICD-10-CM | POA: Diagnosis not present

## 2019-07-11 DIAGNOSIS — D638 Anemia in other chronic diseases classified elsewhere: Secondary | ICD-10-CM | POA: Diagnosis present

## 2019-07-11 DIAGNOSIS — Z66 Do not resuscitate: Secondary | ICD-10-CM | POA: Diagnosis not present

## 2019-07-11 DIAGNOSIS — I482 Chronic atrial fibrillation, unspecified: Secondary | ICD-10-CM | POA: Diagnosis not present

## 2019-07-11 DIAGNOSIS — I340 Nonrheumatic mitral (valve) insufficiency: Secondary | ICD-10-CM | POA: Diagnosis not present

## 2019-07-11 DIAGNOSIS — R0602 Shortness of breath: Secondary | ICD-10-CM

## 2019-07-11 DIAGNOSIS — R1030 Lower abdominal pain, unspecified: Secondary | ICD-10-CM | POA: Diagnosis not present

## 2019-07-11 DIAGNOSIS — K5792 Diverticulitis of intestine, part unspecified, without perforation or abscess without bleeding: Secondary | ICD-10-CM | POA: Diagnosis not present

## 2019-07-11 DIAGNOSIS — I361 Nonrheumatic tricuspid (valve) insufficiency: Secondary | ICD-10-CM | POA: Diagnosis not present

## 2019-07-11 DIAGNOSIS — I5023 Acute on chronic systolic (congestive) heart failure: Secondary | ICD-10-CM | POA: Diagnosis not present

## 2019-07-11 LAB — TSH: TSH: 1.359 u[IU]/mL (ref 0.350–4.500)

## 2019-07-11 LAB — BASIC METABOLIC PANEL
Anion gap: 14 (ref 5–15)
BUN: 14 mg/dL (ref 8–23)
CO2: 18 mmol/L — ABNORMAL LOW (ref 22–32)
Calcium: 9.9 mg/dL (ref 8.9–10.3)
Chloride: 107 mmol/L (ref 98–111)
Creatinine, Ser: 1.14 mg/dL — ABNORMAL HIGH (ref 0.44–1.00)
GFR calc Af Amer: 53 mL/min — ABNORMAL LOW (ref >60)
GFR calc non Af Amer: 46 mL/min — ABNORMAL LOW (ref >60)
Glucose, Bld: 173 mg/dL — ABNORMAL HIGH (ref 70–99)
Potassium: 4.2 mmol/L (ref 3.5–5.1)
Sodium: 139 mmol/L (ref 135–145)

## 2019-07-11 LAB — CBC
HCT: 28.3 % — ABNORMAL LOW (ref 36.0–46.0)
Hemoglobin: 8.8 g/dL — ABNORMAL LOW (ref 12.0–15.0)
MCH: 30.6 pg (ref 26.0–34.0)
MCHC: 31.1 g/dL (ref 30.0–36.0)
MCV: 98.3 fL (ref 80.0–100.0)
Platelets: 204 10*3/uL (ref 150–400)
RBC: 2.88 MIL/uL — ABNORMAL LOW (ref 3.87–5.11)
RDW: 16.6 % — ABNORMAL HIGH (ref 11.5–15.5)
WBC: 6.3 10*3/uL (ref 4.0–10.5)
nRBC: 0.5 % — ABNORMAL HIGH (ref 0.0–0.2)

## 2019-07-11 LAB — HEMOGLOBIN A1C
Hgb A1c MFr Bld: 4.5 % — ABNORMAL LOW (ref 4.8–5.6)
Mean Plasma Glucose: 82.45 mg/dL

## 2019-07-11 LAB — GLUCOSE, CAPILLARY
Glucose-Capillary: 165 mg/dL — ABNORMAL HIGH (ref 70–99)
Glucose-Capillary: 167 mg/dL — ABNORMAL HIGH (ref 70–99)

## 2019-07-11 LAB — ECHOCARDIOGRAM COMPLETE
Height: 64 in
Weight: 2211.65 oz

## 2019-07-11 LAB — BRAIN NATRIURETIC PEPTIDE: B Natriuretic Peptide: 400 pg/mL — ABNORMAL HIGH (ref 0.0–100.0)

## 2019-07-11 LAB — C-REACTIVE PROTEIN: CRP: 18.7 mg/dL — ABNORMAL HIGH (ref <1.0)

## 2019-07-11 LAB — MAGNESIUM: Magnesium: 1.6 mg/dL — ABNORMAL LOW (ref 1.7–2.4)

## 2019-07-11 LAB — ABO/RH: ABO/RH(D): B POS

## 2019-07-11 LAB — D-DIMER, QUANTITATIVE: D-Dimer, Quant: 0.93 ug/mL-FEU — ABNORMAL HIGH (ref 0.00–0.50)

## 2019-07-11 MED ORDER — MORPHINE SULFATE (PF) 2 MG/ML IV SOLN
1.0000 mg | Freq: Once | INTRAVENOUS | Status: AC
  Administered 2019-07-11: 20:00:00 1 mg via INTRAVENOUS
  Filled 2019-07-11: qty 1

## 2019-07-11 MED ORDER — ACETAMINOPHEN 325 MG PO TABS: 650.0000 mg | ORAL_TABLET | Freq: Four times a day (QID) | ORAL | Status: DC | PRN

## 2019-07-11 MED ORDER — HALOPERIDOL LACTATE 5 MG/ML IJ SOLN
2.0000 mg | Freq: Four times a day (QID) | INTRAMUSCULAR | Status: DC | PRN
  Administered 2019-07-11 – 2019-07-12 (×2): 2 mg via INTRAVENOUS
  Filled 2019-07-11 (×3): qty 1

## 2019-07-11 MED ORDER — LORAZEPAM 2 MG/ML IJ SOLN
0.5000 mg | Freq: Once | INTRAMUSCULAR | Status: AC
  Administered 2019-07-11: 21:00:00 0.5 mg via INTRAVENOUS
  Filled 2019-07-11: qty 1

## 2019-07-11 MED ORDER — PANTOPRAZOLE SODIUM 40 MG PO TBEC
40.0000 mg | DELAYED_RELEASE_TABLET | Freq: Every day | ORAL | Status: DC
  Administered 2019-07-11: 15:00:00 40 mg via ORAL
  Filled 2019-07-11: qty 1

## 2019-07-11 MED ORDER — DOCUSATE SODIUM 100 MG PO CAPS
100.0000 mg | ORAL_CAPSULE | Freq: Two times a day (BID) | ORAL | Status: DC
  Filled 2019-07-11: qty 1

## 2019-07-11 MED ORDER — DILTIAZEM HCL 90 MG PO TABS
90.0000 mg | ORAL_TABLET | Freq: Three times a day (TID) | ORAL | Status: DC
  Administered 2019-07-11: 12:00:00 90 mg via ORAL
  Filled 2019-07-11 (×4): qty 1

## 2019-07-11 MED ORDER — APIXABAN 5 MG PO TABS
5.0000 mg | ORAL_TABLET | Freq: Two times a day (BID) | ORAL | Status: DC
  Administered 2019-07-11: 12:00:00 5 mg via ORAL
  Filled 2019-07-11: qty 1

## 2019-07-11 MED ORDER — METRONIDAZOLE IN NACL 5-0.79 MG/ML-% IV SOLN
500.0000 mg | Freq: Three times a day (TID) | INTRAVENOUS | Status: DC
  Administered 2019-07-11 – 2019-07-12 (×2): 500 mg via INTRAVENOUS
  Filled 2019-07-11 (×3): qty 100

## 2019-07-11 MED ORDER — HEPARIN SODIUM (PORCINE) 5000 UNIT/ML IJ SOLN
5000.0000 [IU] | Freq: Three times a day (TID) | INTRAMUSCULAR | Status: DC
  Administered 2019-07-11: 15:00:00 5000 [IU] via SUBCUTANEOUS
  Filled 2019-07-11: qty 1

## 2019-07-11 MED ORDER — INSULIN ASPART 100 UNIT/ML ~~LOC~~ SOLN: 0.0000 [IU] | Freq: Every day | SUBCUTANEOUS | Status: DC

## 2019-07-11 MED ORDER — ONDANSETRON HCL 4 MG/2ML IJ SOLN: 4.0000 mg | Freq: Four times a day (QID) | INTRAMUSCULAR | Status: DC | PRN

## 2019-07-11 MED ORDER — LORAZEPAM 2 MG/ML IJ SOLN
INTRAMUSCULAR | Status: AC
  Administered 2019-07-12: 09:00:00 1 mg via INTRAVENOUS
  Filled 2019-07-11: qty 1

## 2019-07-11 MED ORDER — ALBUTEROL SULFATE HFA 108 (90 BASE) MCG/ACT IN AERS
2.0000 | INHALATION_SPRAY | Freq: Four times a day (QID) | RESPIRATORY_TRACT | Status: DC | PRN
  Filled 2019-07-11: qty 6.7

## 2019-07-11 MED ORDER — ENSURE ENLIVE PO LIQD: 237.0000 mL | Freq: Two times a day (BID) | ORAL | Status: DC

## 2019-07-11 MED ORDER — DILTIAZEM HCL 25 MG/5ML IV SOLN
10.0000 mg | Freq: Four times a day (QID) | INTRAVENOUS | Status: DC | PRN
  Administered 2019-07-12: 10 mg via INTRAVENOUS
  Filled 2019-07-11 (×3): qty 5

## 2019-07-11 MED ORDER — SODIUM CHLORIDE 0.9 % IV SOLN
100.0000 mg | Freq: Every day | INTRAVENOUS | Status: DC
  Administered 2019-07-11: 15:00:00 100 mg via INTRAVENOUS
  Filled 2019-07-11 (×2): qty 20

## 2019-07-11 MED ORDER — SODIUM CHLORIDE 0.9 % IV SOLN
2.0000 g | INTRAVENOUS | Status: DC
  Administered 2019-07-11: 12:00:00 2 g via INTRAVENOUS
  Filled 2019-07-11 (×2): qty 20

## 2019-07-11 MED ORDER — INSULIN ASPART 100 UNIT/ML ~~LOC~~ SOLN
0.0000 [IU] | Freq: Three times a day (TID) | SUBCUTANEOUS | Status: DC
  Administered 2019-07-11 – 2019-07-12 (×3): 2 [IU] via SUBCUTANEOUS

## 2019-07-11 MED ORDER — ONDANSETRON HCL 4 MG PO TABS: 4.0000 mg | ORAL_TABLET | Freq: Four times a day (QID) | ORAL | Status: DC | PRN

## 2019-07-11 MED ORDER — FUROSEMIDE 10 MG/ML IJ SOLN
40.0000 mg | Freq: Once | INTRAMUSCULAR | Status: AC
  Administered 2019-07-11: 12:00:00 40 mg via INTRAVENOUS
  Filled 2019-07-11: qty 4

## 2019-07-11 MED ORDER — MAGNESIUM SULFATE 2 GM/50ML IV SOLN
2.0000 g | Freq: Once | INTRAVENOUS | Status: AC
  Administered 2019-07-11: 16:00:00 2 g via INTRAVENOUS
  Filled 2019-07-11: qty 50

## 2019-07-11 MED ORDER — LORAZEPAM 2 MG/ML IJ SOLN
1.0000 mg | Freq: Four times a day (QID) | INTRAMUSCULAR | Status: DC | PRN
  Administered 2019-07-12 (×2): 1 mg via INTRAVENOUS
  Filled 2019-07-11 (×3): qty 1

## 2019-07-11 MED ORDER — METRONIDAZOLE 500 MG PO TABS
500.0000 mg | ORAL_TABLET | Freq: Three times a day (TID) | ORAL | Status: DC
  Administered 2019-07-11: 500 mg via ORAL
  Filled 2019-07-11 (×4): qty 1

## 2019-07-11 MED ORDER — ALPRAZOLAM 0.5 MG PO TABS
0.2500 mg | ORAL_TABLET | Freq: Two times a day (BID) | ORAL | Status: DC
  Administered 2019-07-11: 15:00:00 0.25 mg via ORAL
  Filled 2019-07-11: qty 1

## 2019-07-11 MED ORDER — METHYLPREDNISOLONE SODIUM SUCC 125 MG IJ SOLR
80.0000 mg | Freq: Two times a day (BID) | INTRAMUSCULAR | Status: DC
  Administered 2019-07-11 (×2): 80 mg via INTRAVENOUS
  Filled 2019-07-11 (×2): qty 2

## 2019-07-11 MED ORDER — METOLAZONE 2.5 MG PO TABS
2.5000 mg | ORAL_TABLET | Freq: Once | ORAL | Status: AC
  Administered 2019-07-11: 18:00:00 2.5 mg via ORAL
  Filled 2019-07-11: qty 1

## 2019-07-11 MED ORDER — FUROSEMIDE 10 MG/ML IJ SOLN
40.0000 mg | Freq: Once | INTRAMUSCULAR | Status: AC
  Administered 2019-07-11: 15:00:00 40 mg via INTRAVENOUS
  Filled 2019-07-11: qty 4

## 2019-07-11 NOTE — Progress Notes (Signed)
[name] updated.

## 2019-07-11 NOTE — Progress Notes (Signed)
  Echocardiogram 2D Echocardiogram has been performed.  Jennifer Holden 07/13/2019, 4:43 PM

## 2019-07-11 NOTE — H&P (Signed)
TRH H&P   Patient Demographics:    Jennifer Holden, is a 79 y.o. female  MRN: [id number]   DOB - Jul 20, 1940  Admit Date - 07/22/2019  Outpatient Primary MD for the patient is Townsend Roger, MD    Patient coming from: [facility] ER  No chief complaint on file.     HPI:    Jennifer Holden  is a 79 y.o. female, with history of hypertension, GERD, anemia of chronic disease, who lives at home with close assistance from family members was apparently brought on to [facility] with chief complaints of abdominal pain and some shortness of breath, work-up that suggested mild diverticulitis along with acute hypoxic respiratory failure due to COVID-19 infection, she soon developed A. fib with RVR, she was placed on Cardizem drip and then sent to [facility] for further care.  Patient arrived to [facility] with nonrebreather mask with pulse ox in low 80s, her work-up here suggested acute hypoxic respiratory failure due to COVID-19 pneumonia, possible CHF, diverticulitis per CT scan from [facility], A. fib RVR, patient currently is mildly confused but denies any headache or chest pain, denies any abdominal pain at this time, does state that she is short of breath, no focal weakness.    Review of systems:    A  full 10 point Review of Systems was done, except as stated above, all other Review of Systems were negative.   With Past History of the following :    Past Medical History:  Diagnosis Date  . Anemia   . Bleeding nose   . Bruises easily   . Sinus problem       Past Surgical History:  Procedure Laterality Date  . arm surgery    . FOOT SURGERY    . NOSE SURGERY        Social History:     Social History   Tobacco Use  .  Smoking status: Never Smoker  . Smokeless tobacco: Never Used  Substance Use Topics  . Alcohol use: No    Alcohol/week: 0.0 standard drinks         Family History :   No family history for COVID-19 infection   Home Medications:   Prior to Admission medications   Medication Sig Start Date End Date Taking? Authorizing Provider  ALPRAZolam Duanne Moron) 0.5 MG tablet  03/31/14   [provider]  ferrous sulfate 324 (65 FE) MG TBEC  04/25/14   [provider]  furosemide (LASIX) 20 MG tablet  01/31/14   [provider]  HYDROcodone-acetaminophen (NORCO/VICODIN) 5-325 MG per tablet  04/15/14   [provider]  hydrOXYzine (ATARAX/VISTARIL) 25 MG tablet  04/13/14   [provider]  methylPREDNIsolone (MEDROL DOSPACK) 4 MG tablet  04/13/14   [provider]  pantoprazole (PROTONIX) 40 MG tablet  03/17/14   [provider]  zolpidem (AMBIEN) 10 MG tablet  05/01/14   [provider]     Allergies:     Allergies  Allergen Reactions  . Asa [Aspirin] Other (See Comments)    bleeding  . Penicillins Itching    Tolerated cefepime  . Sulfa Antibiotics Nausea And Vomiting     Physical Exam:   Vitals  Blood pressure 132/72, pulse (!) 108, temperature (!) 97.5 F (36.4 C), temperature source Oral, resp. rate (!) 47, height 5\' 4"  (1.626 m), weight 62.7 kg, SpO2 (!) 65 %.   1. General early frail African-American female lying in hospital bed in some shortness of breath wearing nonrebreather mask, 2. Normal affect and insight, Not Suicidal or Homicidal, Awake Alert, Oriented X 3.  3. No F.N deficits, ALL C.Nerves Intact, Strength 5/5 all 4 extremities, Sensation intact all 4 extremities, Plantars down going.  4. Ears and Eyes appear Normal, Conjunctivae clear, PERRLA. Moist Oral Mucosa.  5. Supple Neck, No JVD, No cervical lymphadenopathy appriciated, No Carotid Bruits.  6. Symmetrical Chest wall movement, Good air  movement bilaterally, coarse bilateral breath sounds,  7. iRRR, No Gallops, Rubs or Murmurs, No Parasternal Heave.  8. Positive Bowel Sounds, Abdomen Soft, No tenderness, No organomegaly appriciated,No rebound -guarding or rigidity.  9.  No Cyanosis, Normal Skin Turgor, No Skin Rash or Bruise.  10. Good muscle tone,  joints appear normal , no effusions, Normal ROM.  11. No Palpable Lymph Nodes in Neck or Axillae      Data Review:    CBC Recent Labs  Lab 07/17/2019 1230  WBC 6.3  HGB 8.8*  HCT 28.3*  PLT 204  MCV 98.3  MCH 30.6  MCHC 31.1  RDW 16.6*   ------------------------------------------------------------------------------------------------------------------  Chemistries  Recent Labs  Lab 07/14/2019 1230  NA 139  K 4.2  CL 107  CO2 18*  GLUCOSE 173*  BUN 14  CREATININE 1.14*  CALCIUM 9.9  MG 1.6*   ------------------------------------------------------------------------------------------------------------------ estimated creatinine clearance is 34.6 mL/min (A) (by C-G formula based on SCr of 1.14 mg/dL (H)). ------------------------------------------------------------------------------------------------------------------ Recent Labs    07/08/2019 1230  TSH 1.359    Coagulation profile No results for input(s): INR, PROTIME in the last 168 hours. ------------------------------------------------------------------------------------------------------------------- Recent Labs    07/05/2019 1230  DDIMER 0.93*   -------------------------------------------------------------------------------------------------------------------  Cardiac Enzymes No results for input(s): CKMB, TROPONINI, MYOGLOBIN in the last 168 hours.  Invalid input(s): CK ------------------------------------------------------------------------------------------------------------------    Component Value Date/Time   BNP 400.0 (H) 07/19/2019 1230      ---------------------------------------------------------------------------------------------------------------  Urinalysis No results found for: COLORURINE, APPEARANCEUR, LABSPEC, PHURINE, GLUCOSEU, HGBUR, BILIRUBINUR, KETONESUR, PROTEINUR, UROBILINOGEN, NITRITE, LEUKOCYTESUR  ----------------------------------------------------------------------------------------------------------------   Imaging Results:    DG Chest Port 1 View  Result Date: 07/04/2019 CLINICAL DATA:  Shortness of breath. EXAM: PORTABLE CHEST 1 VIEW COMPARISON:  July 10, 2019. FINDINGS: Stable cardiomegaly. No pneumothorax or pleural effusion is noted. Mild bilateral diffuse faint lung opacities are noted consistent with pneumonia or possibly edema. Bony thorax  is unremarkable. IMPRESSION: Mild bilateral diffuse lung opacities are noted consistent with pneumonia or possibly edema. Electronically Signed   By: Marijo Conception M.D.   On: 06/26/2019 14:14    My personal review of EKG: Rhythm Afib, rate 80s from [place]:     1.  Acute hypoxic respiratory failure due to combination of COVID-19 pneumonia along with CHF nonspecific.  She will be admitted to the hospital, will be placed on IV steroids and remdesivir, IV Lasix will be provided, will check echocardiogram.  Oxygen supplementation with nonrebreather mask along with high flow oxygen.  Monitor closely.  Situation is tenuous.  If she declines further comfort measures.  2.  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  Lasix, rate control with Cardizem, echo.  3.  New onset A. fib RVR [place] vas 2 score of greater than 3.  For now oral Cardizem for rate control, as needed IV Cardizem for breakthrough as needed along with Eliquis.  4.  Diverticulitis on CT scan from [facility].  Soft diet, stool softener, IV Rocephin and Flagyl.  5.  GERD.  PPI.  96.  Advanced age, frail condition.  Supportive care, PT OT, probable SNF.    DVT  Prophylaxis Eliquis  AM Labs Ordered, also please review Full Orders  Family Communication: Admission, patients condition and plan of care including tests being ordered have been discussed with the patient and son who indicate understanding and agree with the plan and Code Status.  Code Status DNR  Likely DC to  TBD  Condition GUARDED    Consults called: None    Admission status: Inpt    Time spent in minutes : Saunemin M.D on 07/04/2019 at 3:08 PM  To page go to [email or web address] - password [facility]

## 2019-07-11 NOTE — Progress Notes (Signed)
1915 Report received from [facility], patient is in respiratory distress, RR50s, O2 sat 55-60% on 15L HF and 15L NRB, patient unable to complete words due to breathing, patient is not attempting to get out of bed- bilateral wrist restraints removed.  1923 RT called and notified to place patient on heated HF.  1929 Dr. Candiss Norse called, updated him on patient status, RT at bedside.  2040 Patients O2 sats 85% on HHFNC 40L 100%FIO2, patient is restless, pulling on cardiac leads, mittens placed on bilateral hands. Dr. Andria Frames paged and notified of patient status, order received for ativan IV, will continue to monitor.  2120 Patient is alert, when asked if she will take her medicine patient says yes, however, when giving her water to drink to assess swallowing-patient blowing out air, pursing lips and turning head. Dr. Andria Frames notified of inability to give PO meds.

## 2019-07-11 NOTE — Progress Notes (Signed)
Pharmacy Brief Note  RE: medications received at [facility]  07/20/2019 Remdesivir 200mg  IV at 0204 07/21/2019 Dexamethasone 6mg  IV at 0058 07/10/2019 cefepime 2g IV at 0059 07/09/2019 metronidazole 500mg  IV at 0320 07/24/2019 enoxaparin 40mg  sub-q at 0059  Labs SCr 1.0mg /dL             D-Dimer: 1.58mcg/mL-FEU CrClest~39  mL/min   platelets:173K AST 88  ALT 37           LDH 1395 CRP 196.1ng/mL        Lipase 459 LA 3.2>>1.6     Despina Pole, Pharm. D. Clinical Pharmacist 07/10/2019 7:29 AM

## 2019-07-11 NOTE — Progress Notes (Signed)
Rapid Response Event Note  Overview: New Admit from [place], RN called ICU Charge and RT to further assess patient continued desaturations.   Initial Focused Assessment: Patient breathing >40/min, oxygen saturations 82-86% 15LHiflo and NRB. No subjective distress or pain. Diminished bilateral lung sounds.  Interventions: MD paged.    Plan of Care (if not transferred): Continue NONINVASIVE oxygen therapies. Patient is not an ICU candidate. Per MD, oxygen saturations goal >88% - patient will be moved to [facility] for potential need of heated hiflo.   Event Summary: Patient continuously monitored on telemetry and pulse ox. Per MD, patient should be moved to [facility] for a room that has the capacity to initiate Heated HiFlo if necessary.    Jennifer Holden G Maryam Feely

## 2019-07-11 NOTE — Progress Notes (Signed)
ANTICOAGULATION CONSULT NOTE - Initial Consult  Pharmacy Consult for Eliquis & Remdesivir  Indication: atrial fibrillation / Covid-19  Allergies  Allergen Reactions  . Asa [Aspirin] Other (See Comments)    bleeding  . Penicillins Itching    Tolerated cefepime  . Sulfa Antibiotics Nausea And Vomiting    Patient Measurements: Height: 5\' 4"  (162.6 cm) Weight: 138 lb 3.7 oz (62.7 kg) IBW/kg (Calculated) : 54.7  Vital Signs: Temp: 97.5 F (36.4 C) (01/17 1017) Temp Source: Axillary (01/17 1017) BP: 143/68 (01/17 1017) Pulse Rate: 78 (01/17 1017)  Labs: No results for input(s): HGB, HCT, PLT, APTT, LABPROT, INR, HEPARINUNFRC, HEPRLOWMOCWT, CREATININE, CKTOTAL, CKMB, TROPONINIHS in the last 72 hours.  CrCl cannot be calculated (No successful lab value found.).   Medical History: Past Medical History:  Diagnosis Date  . Anemia   . Bleeding nose   . Bruises easily   . Sinus problem     Medications received at [facility]  07/23/2019 Remdesivir 200mg  IV at 0204 07/22/2019 Dexamethasone 6mg  IV at 0058 07/10/2019 cefepime 2g IV at 0059 07/16/2019 metronidazole 500mg  IV at 0320 06/26/2019 enoxaparin 40mg  sub-q at 0059  Labs SCr 1.0mg /dL             D-Dimer: 1.14mcg/mL-FEU CrClest~39  mL/min   platelets:173K AST 88  ALT 37           LDH 1395 CRP 196.1ng/mL        Lipase 459 LA 3.2>>1.6    Assessment: 78 YOF transferred from [facility] is consulted to start Remdesivir and eliquis for Afib.  See above labs abd meds received from [facility]:  Monitor platelets by anticoagulation protocol: Yes   Plan:  Remdesivir 100 mg daily x 4 doses, give today's dose at 1600 then q 1000 Eliquis 5mg  PO BID first dose now Monitor renal function and CBC  Maryanna Shape, PharmD, BCPS, BCPPS Clinical Pharmacist  Pager: [phone]   07/18/2019,11:19 AM

## 2019-07-11 NOTE — Progress Notes (Signed)
Charge RN, Candiss Norse, MD, RT and RRRN notified that pt MEWS in yellow d/t RR and O2 sats. PT maxed out on oxygen. 15L HFNC and NRB. Pt sats in the 70s-80's. Confused. IV lasix given. Pt does not seem to be in distress at this time but may very well decompensate later on in shift. RT and RRRN came to bedside to assess pt. Plan to move pt to C side for possible HHF if needed.

## 2019-07-11 NOTE — Progress Notes (Signed)
  1830: Patient has been extremely restless after PRN Haldol given. Pt is not removing O2 devices and trying to climb out of bed. O2 sats in 50s HR in 120s RR in 40s. MD notified. RT notified to place on heated high. Soft wrist restraints places and PRN Ativan 1mg  given.   1900: Patient remains restless. Work of breathing is very high O2 Sats in mid 21s. RT notified again. MD notified of pt condition. Family updated on condition and wrist restraints place. Will discuss with night RN and night RN will continue to monitor.

## 2019-07-11 NOTE — Progress Notes (Signed)
Pt admitted from [facility]. On NRB. Pt switched over to HFNC at 15L. O2 decreasing to 70's-80's. Pt oriented to name, confused. Small stage 2 noted on pt sacrum. Foam applied. Pt able to move all extremities. Connected to tele monitor. Falls precautions in place. Will continue to monitor pt

## 2019-07-12 ENCOUNTER — Inpatient Hospital Stay (HOSPITAL_COMMUNITY): Payer: Medicare HMO

## 2019-07-12 LAB — COMPREHENSIVE METABOLIC PANEL
ALT: 35 U/L (ref 0–44)
AST: 70 U/L — ABNORMAL HIGH (ref 15–41)
Albumin: 3.1 g/dL — ABNORMAL LOW (ref 3.5–5.0)
Alkaline Phosphatase: 161 U/L — ABNORMAL HIGH (ref 38–126)
Anion gap: 15 (ref 5–15)
BUN: 29 mg/dL — ABNORMAL HIGH (ref 8–23)
CO2: 19 mmol/L — ABNORMAL LOW (ref 22–32)
Calcium: 10 mg/dL (ref 8.9–10.3)
Chloride: 109 mmol/L (ref 98–111)
Creatinine, Ser: 1.83 mg/dL — ABNORMAL HIGH (ref 0.44–1.00)
GFR calc Af Amer: 30 mL/min — ABNORMAL LOW (ref >60)
GFR calc non Af Amer: 26 mL/min — ABNORMAL LOW (ref >60)
Glucose, Bld: 148 mg/dL — ABNORMAL HIGH (ref 70–99)
Potassium: 4 mmol/L (ref 3.5–5.1)
Sodium: 143 mmol/L (ref 135–145)
Total Bilirubin: 0.9 mg/dL (ref 0.3–1.2)
Total Protein: 6.9 g/dL (ref 6.5–8.1)

## 2019-07-12 LAB — MAGNESIUM: Magnesium: 2.4 mg/dL (ref 1.7–2.4)

## 2019-07-12 LAB — CBC WITH DIFFERENTIAL/PLATELET
Abs Immature Granulocytes: 0.09 10*3/uL — ABNORMAL HIGH (ref 0.00–0.07)
Basophils Absolute: 0 10*3/uL (ref 0.0–0.1)
Basophils Relative: 0 %
Eosinophils Absolute: 0 10*3/uL (ref 0.0–0.5)
Eosinophils Relative: 0 %
HCT: 28.2 % — ABNORMAL LOW (ref 36.0–46.0)
Hemoglobin: 8.7 g/dL — ABNORMAL LOW (ref 12.0–15.0)
Immature Granulocytes: 1 %
Lymphocytes Relative: 3 %
Lymphs Abs: 0.3 10*3/uL — ABNORMAL LOW (ref 0.7–4.0)
MCH: 29.8 pg (ref 26.0–34.0)
MCHC: 30.9 g/dL (ref 30.0–36.0)
MCV: 96.6 fL (ref 80.0–100.0)
Monocytes Absolute: 0.6 10*3/uL (ref 0.1–1.0)
Monocytes Relative: 6 %
Neutro Abs: 8.4 10*3/uL — ABNORMAL HIGH (ref 1.7–7.7)
Neutrophils Relative %: 90 %
Platelets: 204 10*3/uL (ref 150–400)
RBC: 2.92 MIL/uL — ABNORMAL LOW (ref 3.87–5.11)
RDW: 16.6 % — ABNORMAL HIGH (ref 11.5–15.5)
WBC: 9.3 10*3/uL (ref 4.0–10.5)
nRBC: 0.6 % — ABNORMAL HIGH (ref 0.0–0.2)

## 2019-07-12 LAB — BRAIN NATRIURETIC PEPTIDE: B Natriuretic Peptide: 476.1 pg/mL — ABNORMAL HIGH (ref 0.0–100.0)

## 2019-07-12 LAB — C-REACTIVE PROTEIN: CRP: 19.1 mg/dL — ABNORMAL HIGH (ref <1.0)

## 2019-07-12 LAB — D-DIMER, QUANTITATIVE: D-Dimer, Quant: 0.78 ug/mL-FEU — ABNORMAL HIGH (ref 0.00–0.50)

## 2019-07-12 LAB — PROCALCITONIN: Procalcitonin: 1.23 ng/mL

## 2019-07-12 LAB — GLUCOSE, CAPILLARY
Glucose-Capillary: 168 mg/dL — ABNORMAL HIGH (ref 70–99)
Glucose-Capillary: 154 mg/dL — ABNORMAL HIGH (ref 70–99)

## 2019-07-12 MED ORDER — LORAZEPAM 2 MG/ML IJ SOLN: 1.0000 mg | Freq: Once | INTRAMUSCULAR | Status: AC

## 2019-07-12 MED ORDER — ENOXAPARIN SODIUM 80 MG/0.8ML ~~LOC~~ SOLN
1.0000 mg/kg | Freq: Two times a day (BID) | SUBCUTANEOUS | Status: DC
  Administered 2019-07-12: 65 mg via SUBCUTANEOUS
  Filled 2019-07-12: qty 0.8

## 2019-07-12 MED ORDER — DILTIAZEM LOAD VIA INFUSION
10.0000 mg | Freq: Once | INTRAVENOUS | Status: AC
  Administered 2019-07-12: 08:00:00 10 mg via INTRAVENOUS
  Filled 2019-07-12: qty 10

## 2019-07-12 MED ORDER — DILTIAZEM HCL-DEXTROSE 125-5 MG/125ML-% IV SOLN (PREMIX)
5.0000 mg/h | INTRAVENOUS | Status: DC
  Administered 2019-07-12: 08:00:00 5 mg/h via INTRAVENOUS
  Filled 2019-07-12: qty 125

## 2019-07-12 MED ORDER — METHYLPREDNISOLONE SODIUM SUCC 125 MG IJ SOLR
60.0000 mg | Freq: Two times a day (BID) | INTRAMUSCULAR | Status: DC
  Administered 2019-07-12: 09:00:00 60 mg via INTRAVENOUS
  Filled 2019-07-12: qty 2

## 2019-07-12 MED ORDER — MORPHINE 100MG IN NS 100ML (1MG/ML) PREMIX INFUSION
5.0000 mg/h | INTRAVENOUS | Status: DC
  Administered 2019-07-12 (×2): 5 mg/h via INTRAVENOUS
  Filled 2019-07-12: qty 100

## 2019-07-12 MED ORDER — FUROSEMIDE 10 MG/ML IJ SOLN
80.0000 mg | Freq: Once | INTRAMUSCULAR | Status: AC
  Administered 2019-07-12: 10:00:00 80 mg via INTRAVENOUS
  Filled 2019-07-12: qty 8

## 2019-07-12 MED ORDER — PANTOPRAZOLE SODIUM 40 MG IV SOLR
40.0000 mg | Freq: Two times a day (BID) | INTRAVENOUS | Status: DC
  Administered 2019-07-12: 09:00:00 40 mg via INTRAVENOUS
  Filled 2019-07-12: qty 40

## 2019-07-12 MED ORDER — METOPROLOL TARTRATE 5 MG/5ML IV SOLN
5.0000 mg | Freq: Once | INTRAVENOUS | Status: AC
  Administered 2019-07-12: 05:00:00 5 mg via INTRAVENOUS
  Filled 2019-07-12: qty 5

## 2019-07-12 MED ORDER — HALOPERIDOL LACTATE 5 MG/ML IJ SOLN
1.0000 mg | Freq: Once | INTRAMUSCULAR | Status: AC
  Administered 2019-07-12: 09:00:00 1 mg via INTRAVENOUS

## 2019-07-12 MED ORDER — MORPHINE SULFATE (PF) 2 MG/ML IV SOLN
1.0000 mg | INTRAVENOUS | Status: DC | PRN
  Administered 2019-07-12 (×4): 1 mg via INTRAVENOUS
  Filled 2019-07-12: qty 1

## 2019-07-26 NOTE — Discharge Instructions (Signed)
Information on my medicine - ELIQUIS (apixaban)  This medication education was reviewed with me or my healthcare representative as part of my discharge preparation.  The pharmacist that spoke with me during my hospital stay was:  Lianne Carreto D Alexsandria Kivett, RPH  Why was Eliquis prescribed for you? Eliquis was prescribed for you to reduce the risk of a blood clot forming that can cause a stroke if you have a medical condition called atrial fibrillation (a type of irregular heartbeat).  What do You need to know about Eliquis ? Take your Eliquis TWICE DAILY - one tablet in the morning and one tablet in the evening with or without food. If you have difficulty swallowing the tablet whole please discuss with your pharmacist how to take the medication safely.  Take Eliquis exactly as prescribed by your doctor and DO NOT stop taking Eliquis without talking to the doctor who prescribed the medication.  Stopping may increase your risk of developing a stroke.  Refill your prescription before you run out.  After discharge, you should have regular check-up appointments with your healthcare provider that is prescribing your Eliquis.  In the future your dose may need to be changed if your kidney function or weight changes by a significant amount or as you get older.  What do you do if you miss a dose? If you miss a dose, take it as soon as you remember on the same day and resume taking twice daily.  Do not take more than one dose of ELIQUIS at the same time to make up a missed dose.  Important Safety Information A possible side effect of Eliquis is bleeding. You should call your healthcare provider right away if you experience any of the following: ? Bleeding from an injury or your nose that does not stop. ? Unusual colored urine (red or dark brown) or unusual colored stools (red or black). ? Unusual bruising for unknown reasons. ? A serious fall or if you hit your head (even if there is no bleeding).  Some  medicines may interact with Eliquis and might increase your risk of bleeding or clotting while on Eliquis. To help avoid this, consult your healthcare provider or pharmacist prior to using any new prescription or non-prescription medications, including herbals, vitamins, non-steroidal anti-inflammatory drugs (NSAIDs) and supplements.  This website has more information on Eliquis (apixaban): http://www.eliquis.com/eliquis/home ----------------------------------------------------------------------------- Atrial Fibrillation   Atrial fibrillation is a type of heartbeat that is irregular or fast. If you have this condition, your heart beats without any order. This makes it hard for your heart to pump blood in a normal way. Atrial fibrillation may come and go, or it may become a long-lasting problem. If this condition is not treated, it can put you at higher risk for stroke, heart failure, and other heart problems. What are the causes? This condition may be caused by diseases that damage the heart. They include:  High blood pressure.  Heart failure.  Heart valve disease.  Heart surgery. Other causes include:  Diabetes.  Thyroid disease.  Being overweight.  Kidney disease. Sometimes the cause is not known. What increases the risk? You are more likely to develop this condition if:  You are older.  You smoke.  You exercise often and very hard.  You have a family history of this condition.  You are a man.  You use drugs.  You drink a lot of alcohol.  You have lung conditions, such as emphysema, pneumonia, or COPD.  You have sleep apnea. What   are the signs or symptoms? Common symptoms of this condition include:  A feeling that your heart is beating very fast.  Chest pain or discomfort.  Feeling short of breath.  Suddenly feeling light-headed or weak.  Getting tired easily during activity.  Fainting.  Sweating. In some cases, there are no symptoms. How is  this treated? Treatment for this condition depends on underlying conditions and how you feel when you have atrial fibrillation. They include:  Medicines to: ? Prevent blood clots. ? Treat heart rate or heart rhythm problems.  Using devices, such as a pacemaker, to correct heart rhythm problems.  Doing surgery to remove the part of the heart that sends bad signals.  Closing an area where clots can form in the heart (left atrial appendage). In some cases, your doctor will treat other underlying conditions. Follow these instructions at home: Medicines  Take over-the-counter and prescription medicines only as told by your doctor.  Do not take any new medicines without first talking to your doctor.  If you are taking blood thinners: ? Talk with your doctor before you take any medicines that have aspirin or NSAIDs, such as ibuprofen, in them. ? Take your medicine exactly as told by your doctor. Take it at the same time each day. ? Avoid activities that could hurt or bruise you. Follow instructions about how to prevent falls. ? Wear a bracelet that says you are taking blood thinners. Or, carry a card that lists what medicines you take. Lifestyle       Do not use any products that have nicotine or tobacco in them. These include cigarettes, e-cigarettes, and chewing tobacco. If you need help quitting, ask your doctor.  Eat heart-healthy foods. Talk with your doctor about the right eating plan for you.  Exercise regularly as told by your doctor.  Do not drink alcohol.  Lose weight if you are overweight.  Do not use drugs, including cannabis. General instructions  If you have a condition that causes breathing to stop for a short period of time (apnea), treat it as told by your doctor.  Keep a healthy weight. Do not use diet pills unless your doctor says they are safe for you. Diet pills may make heart problems worse.  Keep all follow-up visits as told by your doctor. This is  important. Contact a doctor if:  You notice a change in the speed, rhythm, or strength of your heartbeat.  You are taking a blood-thinning medicine and you get more bruising.  You get tired more easily when you move or exercise.  You have a sudden change in weight. Get help right away if:    You have pain in your chest or your belly (abdomen).  You have trouble breathing.  You have side effects of blood thinners, such as blood in your vomit, poop (stool), or pee (urine), or bleeding that cannot stop.  You have any signs of a stroke. "BE FAST" is an easy way to remember the main warning signs: ? B - Balance. Signs are dizziness, sudden trouble walking, or loss of balance. ? E - Eyes. Signs are trouble seeing or a change in how you see. ? F - Face. Signs are sudden weakness or loss of feeling in the face, or the face or eyelid drooping on one side. ? A - Arms. Signs are weakness or loss of feeling in an arm. This happens suddenly and usually on one side of the body. ? S - Speech. Signs are sudden   trouble speaking, slurred speech, or trouble understanding what people say. ? T - Time. Time to call emergency services. Write down what time symptoms started.  You have other signs of a stroke, such as: ? A sudden, very bad headache with no known cause. ? Feeling like you may vomit (nausea). ? Vomiting. ? A seizure. These symptoms may be an emergency. Do not wait to see if the symptoms will go away. Get medical help right away. Call your local emergency services (911 in the [place] Do not drive yourself to the hospital. Summary  Atrial fibrillation is a type of heartbeat that is irregular or fast.  You are at higher risk of this condition if you smoke, are older, have diabetes, or are overweight.  Follow your doctor's instructions about medicines, diet, exercise, and follow-up visits.  Get help right away if you have signs or symptoms of a stroke.  Get help right away if you cannot  catch your breath, or you have chest pain or discomfort. This information is not intended to replace advice given to you by your health care provider. Make sure you discuss any questions you have with your health care provider. Document Revised: 12/02/2018 Document Reviewed: 12/02/2018 Elsevier Patient Education  2020 [employer]   

## 2019-07-26 NOTE — Progress Notes (Signed)
PT Cancellation Note  Patient Details Name: Jennifer Holden MRN: [id number] DOB: 20-Oct-1940   Cancelled Treatment:    Reason Eval/Treat Not Completed: Patient not medically ready.   Shary Decamp [facility] 08-06-19, 10:13 AM [place] Pager [phone] Office [phone]

## 2019-07-26 NOTE — Progress Notes (Signed)
Patient transported with RN & RT on 15 L NRB and 15 L [place], then placed on Heated High [place] 30L, 100% for a family visit.  At the end of the visit, the patient was transported back to room 161 with the 15 L NRB and 15 L [place].  Patient placed on comfort care, NRB removed and [place] for comfort only turned down.

## 2019-07-26 NOTE — Death Summary Note (Signed)
[facility] Death Note                                                                                                                                                                                               Jennifer Holden, is a 79 y.o. female, DOB - 08/20/1940, AVW:[id number]  Admit date - 07/13/2019   Admitting Physician Thurnell Lose, MD  Outpatient Primary MD for the patient is Nona Dell, Corene Cornea, MD  LOS - 1  CC - SOB     Notification: Townsend Roger, MD notified of death of 07/15/2019   Date and Time of Death -Jul 15, 2019 at 1:37 PM  Pronounced by - RN  History of present illness:   Jennifer Holden is a 79 y.o. female with a history of - 79 y.o. female, with history of hypertension, GERD, anemia of chronic disease, who lives at home with close assistance from family members was apparently brought on to [facility] with chief complaints of abdominal pain and some shortness of breath, work-up that suggested mild diverticulitis along with acute hypoxic respiratory failure due to COVID-19 infection, she soon developed A. fib with RVR, she was placed on Cardizem drip and then sent to [facility] for further care on nonrebreather mask.  Inspite appropriate treatment she developed severe respiratory distress and was transitioned to full comfort care on 2019/07/15 with the expectation that she would pass away soon, she was comfortable and passed away few hours later comfortably with full comfort measures in place.   Final Diagnoses:  Cause if death -COVID-19 pneumonia, CHF  Signature  Lala Lund M.D on 07/15/19 at 1:40 PM  [facility]  Office Phone - [phone]  Total clinical and documentation time for today Under 30 minutes   Last Note                                                                     PROGRESS NOTE  Patient Demographics:    Jennifer Holden, is a 79 y.o. female, DOB - [birth year], FAO:[id number]  Outpatient Primary MD for the patient is Nona Dell, Corene Cornea, MD    LOS - 1  Admit date - 07/17/2019    CC - SOB     Brief Narrative  79 y.o. female, with history of hypertension, GERD, anemia of chronic disease, who lives at home with close assistance from family members was apparently brought on to [facility] with chief complaints of abdominal pain and some shortness of breath, work-up that suggested mild diverticulitis along with acute hypoxic respiratory failure due to COVID-19 infection, she soon developed A. fib with RVR, she was placed on Cardizem drip and then sent to [facility] for further care on nonrebreather mask.  Inspite appropriate treatment she developed severe respiratory distress and was transitioned to full comfort care on 07-22-19 with expectation that she might pass away soon.    Subjective:    Jennifer Holden today in bed in severe respiratory distress.   Assessment  & Plan :     1. Acute Hypoxic Resp. Failure due to Acute Covid 19 Viral Pneumonitis during the ongoing 2020 Covid 19 Pandemic - she had severe disease in combination with acute on chronic diastolic CHF with EF 69%, she was admitted in severe respiratory distress and required nonrebreather mask along with high flow oxygen soon after her admission, she was given IV steroids, remdesivir, high-dose IV Lasix but with no improvement.  She also went into A. fib with RVR.  Despite appropriate treatment she was in severe respiratory distress, she was transitioned to full comfort care with morphine drip on 07/22/2019 after conversation with her daughter.  This was also discussed with patient's son-in-law on 07/25/2019 by me at the time of admission as it was becoming clear that she might not survive this  admission.     SpO2: (!) 62 % O2 Flow Rate (L/min): 40 L/min(15L NRB ) FiO2 (%): 100 %  Recent Labs  Lab 07/07/2019 1230 2019/07/22 0640  CRP 18.7* 19.1*  DDIMER 0.93* 0.78*  BNP 400.0* 476.1*  PROCALCITON  --  1.23    Hepatic Function Latest Ref Rng & Units 07/22/19  Total Protein 6.5 - 8.1 g/dL 6.9  Albumin 3.5 - 5.0 g/dL 3.1(L)  AST 15 - 41 U/L 70(H)  ALT 0 - 44 U/L 35  Alk Phosphatase 38 - 126 U/L 161(H)  Total Bilirubin 0.3 - 1.2 mg/dL 0.9    FiO2 (%):  [100 %] 100 %  ABG  No results found for: PHART, PCO2ART, PO2ART, HCO3, TCO2, ACIDBASEDEF, O2SAT        Condition - Extremely Guarded  Family Communication  : Talked to her daughter and son-in-law  Code Status : DNR and now full comfort measures  Diet :   Diet Order            DIET SOFT Room service appropriate? Yes; Fluid consistency: Thin  Diet effective now               Disposition Plan  : Will likely pass away soon on full comfort measures  Consults  : None  Procedures  :    Echo  PUD Prophylaxis :   DVT Prophylaxis  : Was on Eliquis but held  Lab Results  Component Value Date   PLT 204 Jul 22, 2019    Inpatient Medications  Scheduled Meds: . docusate sodium  100 mg Oral BID  . methylPREDNISolone (SOLU-MEDROL) injection  60 mg Intravenous  Q12H   Continuous Infusions: . diltiazem (CARDIZEM) infusion 10 mg/hr (07-29-19 0827)  . morphine 5 mg/hr (07/29/2019 1024)   PRN Meds:.acetaminophen, albuterol, haloperidol lactate, LORazepam, morphine injection, [DISCONTINUED] ondansetron **OR** ondansetron (ZOFRAN) IV  Antibiotics  :    Anti-infectives (From admission, onward)   Start     Dose/Rate Route Frequency Ordered Stop   07/03/2019 2200  metroNIDAZOLE (FLAGYL) IVPB 500 mg  Status:  Discontinued     500 mg 100 mL/hr over 60 Minutes Intravenous Every 8 hours 07/16/2019 2154 Jul 29, 2019 1028   07/19/2019 1600  remdesivir 100 mg in sodium chloride 0.9 % 100 mL IVPB  Status:  Discontinued      100 mg 200 mL/hr over 30 Minutes Intravenous Daily 06/26/2019 1109 07-29-2019 1028   07/22/2019 1400  metroNIDAZOLE (FLAGYL) tablet 500 mg  Status:  Discontinued     500 mg Oral Every 8 hours 07/21/2019 1049 07/16/2019 2154   06/26/2019 1200  cefTRIAXone (ROCEPHIN) 2 g in sodium chloride 0.9 % 100 mL IVPB  Status:  Discontinued     2 g 200 mL/hr over 30 Minutes Intravenous Every 24 hours 07/02/2019 1049 07-29-19 1028       Time Spent in minutes  30   Lala Lund M.D on 07/29/2019 at 1:40 PM  To page go to [email or web address] - password [facility]  [facility] -  Office  [phone]    See all Orders from today for further details    Objective:   Vitals:   Jul 29, 2019 0700 2019/07/29 0800 07/29/2019 0830 2019-07-29 0900  BP:  105/83  (!) 153/121  Pulse: (!) 117 (!) 104 (!) 131 (!) 46  Resp: (!) 45 (!) 31 (!) 44 (!) 29  Temp:  98.5 F (36.9 C)    TempSrc:  Axillary    SpO2: 91% (!) 89% (!) 78% (!) 62%  Weight:      Height:        Wt Readings from Last 3 Encounters:  07/22/2019 62.7 kg     Intake/Output Summary (Last 24 hours) at 2019-07-29 1340 Last data filed at Jul 29, 2019 0800 Gross per 24 hour  Intake 510.61 ml  Output 600 ml  Net -89.39 ml     Physical Exam  Awake and appears to be in severe respiratory distress [place].AT,PERRAL Supple Neck,No JVD, No cervical lymphadenopathy appriciated.  Symmetrical Chest wall movement, Good air movement bilaterally, bilateral crackles and coarse breath sounds iRRR,No Gallops,Rubs or new Murmurs, No Parasternal Heave +ve B.Sounds, Abd Soft, No tenderness, No organomegaly appriciated, No rebound - guarding or rigidity. No Cyanosis, Clubbing or edema, No new Rash or bruise     Data Review:    CBC Recent Labs  Lab 06/30/2019 1230 2019-07-29 0640  WBC 6.3 9.3  HGB 8.8* 8.7*  HCT 28.3* 28.2*  PLT 204 204  MCV 98.3 96.6  MCH 30.6 29.8  MCHC 31.1 30.9  RDW 16.6* 16.6*  LYMPHSABS  --  0.3*  MONOABS  --  0.6  EOSABS  --  0.0  BASOSABS  --  0.0     Chemistries  Recent Labs  Lab 07/19/2019 1230 29-Jul-2019 0640  NA 139 143  K 4.2 4.0  CL 107 109  CO2 18* 19*  GLUCOSE 173* 148*  BUN 14 29*  CREATININE 1.14* 1.83*  CALCIUM 9.9 10.0  MG 1.6* 2.4  AST  --  70*  ALT  --  35  ALKPHOS  --  161*  BILITOT  --  0.9   ------------------------------------------------------------------------------------------------------------------ No results  for input(s): CHOL, HDL, LDLCALC, TRIG, CHOLHDL, LDLDIRECT in the last 72 hours.  Lab Results  Component Value Date   HGBA1C 4.5 (L) 06/25/2019   ------------------------------------------------------------------------------------------------------------------ Recent Labs    06/26/2019 1230  TSH 1.359    Cardiac Enzymes No results for input(s): CKMB, TROPONINI, MYOGLOBIN in the last 168 hours.  Invalid input(s): CK ------------------------------------------------------------------------------------------------------------------    Component Value Date/Time   BNP 476.1 (H) 08-03-19 3244    Micro Results No results found for this or any previous visit (from the past 240 hour(s)).  Radiology Reports DG Chest Port 1 View  Result Date: 08-03-2019 CLINICAL DATA:  Shortness of breath. EXAM: PORTABLE CHEST 1 VIEW COMPARISON:  07/16/2019 FINDINGS: The patient remains rotated to the right with unchanged cardiomediastinal silhouette including cardiomegaly. Fine interstitial opacities are again seen diffusely throughout both lungs, and there is also a similar appearance of patchy airspace opacities which are most notable in the left lung base. No sizable pleural effusion or pneumothorax is identified although the patient's chin partially obscures the right greater than left lung apices. IMPRESSION: Unchanged bilateral interstitial and airspace opacities which may reflect pneumonia or edema. Electronically Signed   By: Logan Bores M.D.   On: August 03, 2019 08:07   DG Chest Port 1 View  Result  Date: 07/07/2019 CLINICAL DATA:  Shortness of breath. EXAM: PORTABLE CHEST 1 VIEW COMPARISON:  July 10, 2019. FINDINGS: Stable cardiomegaly. No pneumothorax or pleural effusion is noted. Mild bilateral diffuse faint lung opacities are noted consistent with pneumonia or possibly edema. Bony thorax is unremarkable. IMPRESSION: Mild bilateral diffuse lung opacities are noted consistent with pneumonia or possibly edema. Electronically Signed   By: Marijo Conception M.D.   On: 07/22/2019 14:14   ECHOCARDIOGRAM COMPLETE  Result Date: 07/01/2019   ECHOCARDIOGRAM REPORT   Patient Name:   Jennifer Holden Date of Exam: 07/20/2019 Medical Rec #:  [id number]       Height:       64.0 in Accession #:    [id number]      Weight:       138.2 lb Date of Birth:  [birth year]       BSA:          1.67 m Patient Age:    79 years        BP:           132/72 mmHg Patient Gender: F               HR:           95 bpm. Exam Location:  Inpatient Procedure: 2D Echo Indications:    Atrial fibrillation  History:        Patient has no prior history of Echocardiogram examinations.                 Covid.  Sonographer:    Johny Chess Referring Phys: Graylin Shiver [facility]  Sonographer Comments: Image acquisition challenging due to uncooperative patient and too much light in room. IMPRESSIONS  1. Left ventricular ejection fraction, by visual estimation, is 60 to 65%. The left ventricle has normal function. There is no left ventricular hypertrophy.  2. The left ventricle has no regional wall motion abnormalities.  3. Global right ventricle has normal systolic function.The right ventricular size is normal. No increase in right ventricular wall thickness.  4. Left atrial size was normal.  5. Right atrial size was normal.  6. The mitral valve is normal in structure. Mild mitral  valve regurgitation. No evidence of mitral stenosis.  7. The tricuspid valve is normal in structure.  8. The aortic valve was not well visualized. Aortic valve regurgitation  is mild. Mild aortic valve sclerosis without stenosis.  9. The pulmonic valve was not well visualized. Pulmonic valve regurgitation is not visualized. 10. Mildly elevated pulmonary artery systolic pressure. 11. The inferior vena cava is normal in size with greater than 50% respiratory variability, suggesting right atrial pressure of 3 mmHg. FINDINGS  Left Ventricle: Left ventricular ejection fraction, by visual estimation, is 60 to 65%. The left ventricle has normal function. The left ventricle has no regional wall motion abnormalities. There is no left ventricular hypertrophy. Normal left atrial pressure. Right Ventricle: The right ventricular size is normal. No increase in right ventricular wall thickness. Global RV systolic function is has normal systolic function. The tricuspid regurgitant velocity is 2.87 m/s, and with an assumed right atrial pressure  of 5 mmHg, the estimated right ventricular systolic pressure is mildly elevated at 37.9 mmHg. Left Atrium: Left atrial size was normal in size. Right Atrium: Right atrial size was normal in size Pericardium: There is no evidence of pericardial effusion. Mitral Valve: The mitral valve is normal in structure. There is mild thickening of the mitral valve leaflet(s). Mild mitral valve regurgitation. No evidence of mitral valve stenosis by observation. Tricuspid Valve: The tricuspid valve is normal in structure. Tricuspid valve regurgitation mild-moderate. Aortic Valve: The aortic valve was not well visualized. Aortic valve regurgitation is mild. Mild aortic valve sclerosis is present, with no evidence of aortic valve stenosis. Pulmonic Valve: The pulmonic valve was not well visualized. Pulmonic valve regurgitation is not visualized. Pulmonic regurgitation is not visualized. Aorta: The aortic root, ascending aorta and aortic arch are all structurally normal, with no evidence of dilitation or obstruction. Venous: The inferior vena cava is normal in size with greater  than 50% respiratory variability, suggesting right atrial pressure of 3 mmHg. IAS/Shunts: No atrial level shunt detected by color flow Doppler. There is no evidence of a patent foramen ovale. No ventricular septal defect is seen or detected. There is no evidence of an atrial septal defect.  LEFT VENTRICLE PLAX 2D LVIDd:         4.53 cm LVIDs:         3.59 cm LV PW:         1.10 cm LV IVS:        1.01 cm LV SV:         40 ml LV SV Index:   23.56  LEFT ATRIUM           Index LA diam:      3.40 cm 2.03 cm/m LA Vol (A4C): 64.3 ml 38.45 ml/m   AORTA Ao Root diam: 2.70 cm Ao Asc diam:  3.20 cm TRICUSPID VALVE TR Peak grad:   32.9 mmHg TR Vmax:        295.00 cm/s  Jenkins Rouge MD Electronically signed by Jenkins Rouge MD Signature Date/Time: 07/25/2019/4:48:32 PM    Final

## 2019-07-26 NOTE — Progress Notes (Signed)
Pt restless, agitated, tachypneic, and tachycardiac upon arrival to shift this am. Pt oxygen saturation 60-85% intermittently. PRN haldol given with no effect. Dr Candiss Norse aware, one time dose ativan and haldol given per [facility]. Dr Candiss Norse then at bedside. PRN morphine and one time dose lasix ordered and given. Plan for comfort measures if pt remains uncomfortable. Pt remains uncomfortable, morphine gtt and comfort care measures initiated. Pt family made aware, family visit arranged. Pt appears comfortable at this time.

## 2019-07-26 NOTE — Progress Notes (Signed)
ANTICOAGULATION CONSULT NOTE - Initial Consult  Pharmacy Consult for Lovenox Indication: atrial fibrillation  Allergies  Allergen Reactions  . Asa [Aspirin] Other (See Comments)    bleeding  . Penicillins Itching    Tolerated cefepime  . Sulfa Antibiotics Nausea And Vomiting    Patient Measurements: Height: 5\' 4"  (162.6 cm) Weight: 138 lb 3.7 oz (62.7 kg) IBW/kg (Calculated) : 54.7  Vital Signs: Temp: 98.1 F (36.7 C) (01/18 0505) Temp Source: Axillary (01/18 0505) BP: 105/82 (01/18 0600) Pulse Rate: 106 (01/18 0600)  Labs: Recent Labs    06/25/2019 1230  HGB 8.8*  HCT 28.3*  PLT 204  CREATININE 1.14*    Estimated Creatinine Clearance: 34.6 mL/min (A) (by C-G formula based on SCr of 1.14 mg/dL (H)).   Medical History: Past Medical History:  Diagnosis Date  . Anemia   . Bleeding nose   . Bruises easily   . Sinus problem     Medications:  Medications Prior to Admission  Medication Sig Dispense Refill Last Dose  . ALPRAZolam (XANAX) 0.5 MG tablet      . ferrous sulfate 324 (65 FE) MG TBEC   11   . furosemide (LASIX) 20 MG tablet   0   . HYDROcodone-acetaminophen (NORCO/VICODIN) 5-325 MG per tablet      . hydrOXYzine (ATARAX/VISTARIL) 25 MG tablet   2   . methylPREDNIsolone (MEDROL DOSPACK) 4 MG tablet   0   . pantoprazole (PROTONIX) 40 MG tablet   2   . zolpidem (AMBIEN) 10 MG tablet   2     Assessment: 1 YOF with COVID-19 pneumonia with new Afib RVR started on apixaban. Pharmacy consulted to transition patient to treatment dose Lovenox due to difficulty swallowing. H/H is low. Plt wnl. CrCl ~ 35 mL/min. Of note, patient's last dose of apixaban was given around noon yesterday.   Goal of Therapy:  Anti-Xa level 0.6-1 units/ml 4hrs after LMWH dose given Monitor platelets by anticoagulation protocol: Yes   Plan:  -Start Lovenox 1 mg/kg (65 mg) twice daily  -Monitor CBC, renal fx, and s/s of bleeding   Albertina Parr, PharmD., BCPS Clinical  Pharmacist Clinical phone for 07/11/18 until 5pm: [phone]

## 2019-07-26 NOTE — Progress Notes (Signed)
PROGRESS NOTE                                                                                                                                                                                                             Patient Demographics:    Jennifer Holden, is a 79 y.o. female, DOB - [birth year], MLY:[id number]  Outpatient Primary MD for the patient is Nona Dell, Corene Cornea, MD    LOS - 1  Admit date - 07/05/2019    CC - SOB     Brief Narrative  79 y.o. female, with history of hypertension, GERD, anemia of chronic disease, who lives at home with close assistance from family members was apparently brought on to [facility] with chief complaints of abdominal pain and some shortness of breath, work-up that suggested mild diverticulitis along with acute hypoxic respiratory failure due to COVID-19 infection, she soon developed A. fib with RVR, she was placed on Cardizem drip and then sent to [facility] for further care on nonrebreather mask.  Inspite appropriate treatment she developed severe respiratory distress and was transitioned to full comfort care on 08-02-2019 with expectation that she might pass away soon.    Subjective:    Jennifer Holden today in bed in severe respiratory distress.   Assessment  & Plan :     1. Acute Hypoxic Resp. Failure due to Acute Covid 19 Viral Pneumonitis during the ongoing 2020 Covid 19 Pandemic - she had severe disease in combination with acute on chronic diastolic CHF with EF 81%, she was admitted in severe respiratory distress and required nonrebreather mask along with high flow oxygen soon after her admission, she was given IV steroids, remdesivir, high-dose IV Lasix but with no improvement.  She also went into A. fib with RVR.  Despite appropriate treatment she was in severe respiratory distress, she was transitioned to full comfort care with morphine drip on 02-Aug-2019 after conversation with her daughter.   This was also discussed with patient's son-in-law on 07/25/2019 by me at the time of admission as it was becoming clear that she might not survive this admission.     SpO2: (!) 62 % O2 Flow Rate (L/min): 40 L/min(15L NRB ) FiO2 (%): 100 %  Recent Labs  Lab 07/05/2019 1230 Aug 02, 2019 0640  CRP 18.7* 19.1*  DDIMER 0.93* 0.78*  BNP 400.0* 476.1*  PROCALCITON  --  1.23    Hepatic Function Latest Ref Rng & Units 08-07-19  Total Protein 6.5 - 8.1 g/dL 6.9  Albumin 3.5 - 5.0 g/dL 3.1(L)  AST 15 - 41 U/L 70(H)  ALT 0 - 44 U/L 35  Alk Phosphatase 38 - 126 U/L 161(H)  Total Bilirubin 0.3 - 1.2 mg/dL 0.9    FiO2 (%):  [100 %] 100 %  ABG  No results found for: PHART, PCO2ART, PO2ART, HCO3, TCO2, ACIDBASEDEF, O2SAT        Condition - Extremely Guarded  Family Communication  : Talked to her daughter and son-in-law  Code Status : DNR and now full comfort measures  Diet :   Diet Order            DIET SOFT Room service appropriate? Yes; Fluid consistency: Thin  Diet effective now               Disposition Plan  : Will likely pass away soon on full comfort measures  Consults  : None  Procedures  :    Echo  PUD Prophylaxis :   DVT Prophylaxis  : Was on Eliquis but held  Lab Results  Component Value Date   PLT 204 08-07-19    Inpatient Medications  Scheduled Meds: . docusate sodium  100 mg Oral BID  . methylPREDNISolone (SOLU-MEDROL) injection  60 mg Intravenous Q12H   Continuous Infusions: . diltiazem (CARDIZEM) infusion 10 mg/hr (Aug 07, 2019 0827)  . morphine 5 mg/hr (2019/08/07 1024)   PRN Meds:.acetaminophen, albuterol, haloperidol lactate, LORazepam, morphine injection, [DISCONTINUED] ondansetron **OR** ondansetron (ZOFRAN) IV  Antibiotics  :    Anti-infectives (From admission, onward)   Start     Dose/Rate Route Frequency Ordered Stop   07/18/2019 2200  metroNIDAZOLE (FLAGYL) IVPB 500 mg  Status:  Discontinued     500 mg 100 mL/hr over 60 Minutes  Intravenous Every 8 hours 07/16/2019 2154 2019-08-07 1028   07/13/2019 1600  remdesivir 100 mg in sodium chloride 0.9 % 100 mL IVPB  Status:  Discontinued     100 mg 200 mL/hr over 30 Minutes Intravenous Daily 07/23/2019 1109 2019/08/07 1028   07/01/2019 1400  metroNIDAZOLE (FLAGYL) tablet 500 mg  Status:  Discontinued     500 mg Oral Every 8 hours 07/21/2019 1049 07/21/2019 2154   07/25/2019 1200  cefTRIAXone (ROCEPHIN) 2 g in sodium chloride 0.9 % 100 mL IVPB  Status:  Discontinued     2 g 200 mL/hr over 30 Minutes Intravenous Every 24 hours 07/05/2019 1049 08-07-19 1028       Time Spent in minutes  30   Lala Lund M.D on 08-07-19 at 10:31 AM  To page go to [email or web address] - password [facility]  [facility] -  Office  [phone]    See all Orders from today for further details    Objective:   Vitals:   08/07/2019 0700 08/07/2019 0800 08-07-2019 0830 08-07-19 0900  BP:  105/83  (!) 153/121  Pulse: (!) 117 (!) 104 (!) 131 (!) 46  Resp: (!) 45 (!) 31 (!) 44 (!) 29  Temp:  98.5 F (36.9 C)    TempSrc:  Axillary    SpO2: 91% (!) 89% (!) 78% (!) 62%  Weight:      Height:        Wt Readings from Last 3 Encounters:  07/25/2019 62.7 kg     Intake/Output Summary (Last 24 hours) at Aug 07, 2019 1031 Last data filed at 08-07-2019 0800 Gross  per 24 hour  Intake 610.61 ml  Output 600 ml  Net 10.61 ml     Physical Exam  Awake and appears to be in severe respiratory distress .AT,PERRAL Supple Neck,No JVD, No cervical lymphadenopathy appriciated.  Symmetrical Chest wall movement, Good air movement bilaterally, bilateral crackles and coarse breath sounds iRRR,No Gallops,Rubs or new Murmurs, No Parasternal Heave +ve B.Sounds, Abd Soft, No tenderness, No organomegaly appriciated, No rebound - guarding or rigidity. No Cyanosis, Clubbing or edema, No new Rash or bruise     Data Review:    CBC Recent Labs  Lab 06/30/2019 1230 07-31-19 0640  WBC 6.3 9.3  HGB 8.8* 8.7*  HCT 28.3* 28.2*   PLT 204 204  MCV 98.3 96.6  MCH 30.6 29.8  MCHC 31.1 30.9  RDW 16.6* 16.6*  LYMPHSABS  --  0.3*  MONOABS  --  0.6  EOSABS  --  0.0  BASOSABS  --  0.0    Chemistries  Recent Labs  Lab 07/09/2019 1230 2019/07/31 0640  NA 139 143  K 4.2 4.0  CL 107 109  CO2 18* 19*  GLUCOSE 173* 148*  BUN 14 29*  CREATININE 1.14* 1.83*  CALCIUM 9.9 10.0  MG 1.6* 2.4  AST  --  70*  ALT  --  35  ALKPHOS  --  161*  BILITOT  --  0.9   ------------------------------------------------------------------------------------------------------------------ No results for input(s): CHOL, HDL, LDLCALC, TRIG, CHOLHDL, LDLDIRECT in the last 72 hours.  Lab Results  Component Value Date   HGBA1C 4.5 (L) 06/27/2019   ------------------------------------------------------------------------------------------------------------------ Recent Labs    07/20/2019 1230  TSH 1.359    Cardiac Enzymes No results for input(s): CKMB, TROPONINI, MYOGLOBIN in the last 168 hours.  Invalid input(s): CK ------------------------------------------------------------------------------------------------------------------    Component Value Date/Time   BNP 476.1 (H) 07/31/2019 7510    Micro Results No results found for this or any previous visit (from the past 240 hour(s)).  Radiology Reports DG Chest Port 1 View  Result Date: 07-31-19 CLINICAL DATA:  Shortness of breath. EXAM: PORTABLE CHEST 1 VIEW COMPARISON:  07/17/2019 FINDINGS: The patient remains rotated to the right with unchanged cardiomediastinal silhouette including cardiomegaly. Fine interstitial opacities are again seen diffusely throughout both lungs, and there is also a similar appearance of patchy airspace opacities which are most notable in the left lung base. No sizable pleural effusion or pneumothorax is identified although the patient's chin partially obscures the right greater than left lung apices. IMPRESSION: Unchanged bilateral interstitial and  airspace opacities which may reflect pneumonia or edema. Electronically Signed   By: Logan Bores M.D.   On: 07-31-19 08:07   DG Chest Port 1 View  Result Date: 07/05/2019 CLINICAL DATA:  Shortness of breath. EXAM: PORTABLE CHEST 1 VIEW COMPARISON:  July 10, 2019. FINDINGS: Stable cardiomegaly. No pneumothorax or pleural effusion is noted. Mild bilateral diffuse faint lung opacities are noted consistent with pneumonia or possibly edema. Bony thorax is unremarkable. IMPRESSION: Mild bilateral diffuse lung opacities are noted consistent with pneumonia or possibly edema. Electronically Signed   By: Marijo Conception M.D.   On: 07/19/2019 14:14   ECHOCARDIOGRAM COMPLETE  Result Date: 07/01/2019   ECHOCARDIOGRAM REPORT   Patient Name:   Jennifer Holden Date of Exam: 07/10/2019 Medical Rec #:  [id number]       Height:       64.0 in Accession #:    [id number]      Weight:       138.2  lb Date of Birth:  June 04, 1941       BSA:          1.67 m Patient Age:    35 years        BP:           132/72 mmHg Patient Gender: F               HR:           95 bpm. Exam Location:  Inpatient Procedure: 2D Echo Indications:    Atrial fibrillation  History:        Patient has no prior history of Echocardiogram examinations.                 Covid.  Sonographer:    Johny Chess Referring Phys: Graylin Shiver [facility]  Sonographer Comments: Image acquisition challenging due to uncooperative patient and too much light in room. IMPRESSIONS  1. Left ventricular ejection fraction, by visual estimation, is 60 to 65%. The left ventricle has normal function. There is no left ventricular hypertrophy.  2. The left ventricle has no regional wall motion abnormalities.  3. Global right ventricle has normal systolic function.The right ventricular size is normal. No increase in right ventricular wall thickness.  4. Left atrial size was normal.  5. Right atrial size was normal.  6. The mitral valve is normal in structure. Mild mitral valve  regurgitation. No evidence of mitral stenosis.  7. The tricuspid valve is normal in structure.  8. The aortic valve was not well visualized. Aortic valve regurgitation is mild. Mild aortic valve sclerosis without stenosis.  9. The pulmonic valve was not well visualized. Pulmonic valve regurgitation is not visualized. 10. Mildly elevated pulmonary artery systolic pressure. 11. The inferior vena cava is normal in size with greater than 50% respiratory variability, suggesting right atrial pressure of 3 mmHg. FINDINGS  Left Ventricle: Left ventricular ejection fraction, by visual estimation, is 60 to 65%. The left ventricle has normal function. The left ventricle has no regional wall motion abnormalities. There is no left ventricular hypertrophy. Normal left atrial pressure. Right Ventricle: The right ventricular size is normal. No increase in right ventricular wall thickness. Global RV systolic function is has normal systolic function. The tricuspid regurgitant velocity is 2.87 m/s, and with an assumed right atrial pressure  of 5 mmHg, the estimated right ventricular systolic pressure is mildly elevated at 37.9 mmHg. Left Atrium: Left atrial size was normal in size. Right Atrium: Right atrial size was normal in size Pericardium: There is no evidence of pericardial effusion. Mitral Valve: The mitral valve is normal in structure. There is mild thickening of the mitral valve leaflet(s). Mild mitral valve regurgitation. No evidence of mitral valve stenosis by observation. Tricuspid Valve: The tricuspid valve is normal in structure. Tricuspid valve regurgitation mild-moderate. Aortic Valve: The aortic valve was not well visualized. Aortic valve regurgitation is mild. Mild aortic valve sclerosis is present, with no evidence of aortic valve stenosis. Pulmonic Valve: The pulmonic valve was not well visualized. Pulmonic valve regurgitation is not visualized. Pulmonic regurgitation is not visualized. Aorta: The aortic root,  ascending aorta and aortic arch are all structurally normal, with no evidence of dilitation or obstruction. Venous: The inferior vena cava is normal in size with greater than 50% respiratory variability, suggesting right atrial pressure of 3 mmHg. IAS/Shunts: No atrial level shunt detected by color flow Doppler. There is no evidence of a patent foramen ovale. No ventricular septal defect is seen or  detected. There is no evidence of an atrial septal defect.  LEFT VENTRICLE PLAX 2D LVIDd:         4.53 cm LVIDs:         3.59 cm LV PW:         1.10 cm LV IVS:        1.01 cm LV SV:         40 ml LV SV Index:   23.56  LEFT ATRIUM           Index LA diam:      3.40 cm 2.03 cm/m LA Vol (A4C): 64.3 ml 38.45 ml/m   AORTA Ao Root diam: 2.70 cm Ao Asc diam:  3.20 cm TRICUSPID VALVE TR Peak grad:   32.9 mmHg TR Vmax:        295.00 cm/s  Jenkins Rouge MD Electronically signed by Jenkins Rouge MD Signature Date/Time: 07/08/2019/4:48:32 PM    Final

## 2019-07-26 NOTE — Progress Notes (Signed)
OT Cancellation Note  Patient Details Name: Naleigh Bookhart MRN: [id number] DOB: 14-Sep-1940   Cancelled Treatment:    Reason Eval/Treat Not Completed: Other (comment). Pt placed on comfort care today. OT signing off.   Makalah Asberry,HILLARY 07/26/2019, 10:56 AM  Maurie Boettcher, OT/L   Acute OT Clinical Specialist Acute Rehabilitation Services Pager [phone] Office [phone]

## 2019-07-26 DEATH — deceased

## 2020-07-25 DEATH — deceased

## 2020-07-26 IMAGING — DX DG CHEST 1V PORT
1 series · 1 of 1 positions shown · non-contrast
Comparison: July 10, 2019.

CLINICAL DATA: Shortness of breath.

EXAM:
PORTABLE CHEST 1 VIEW

[chest]
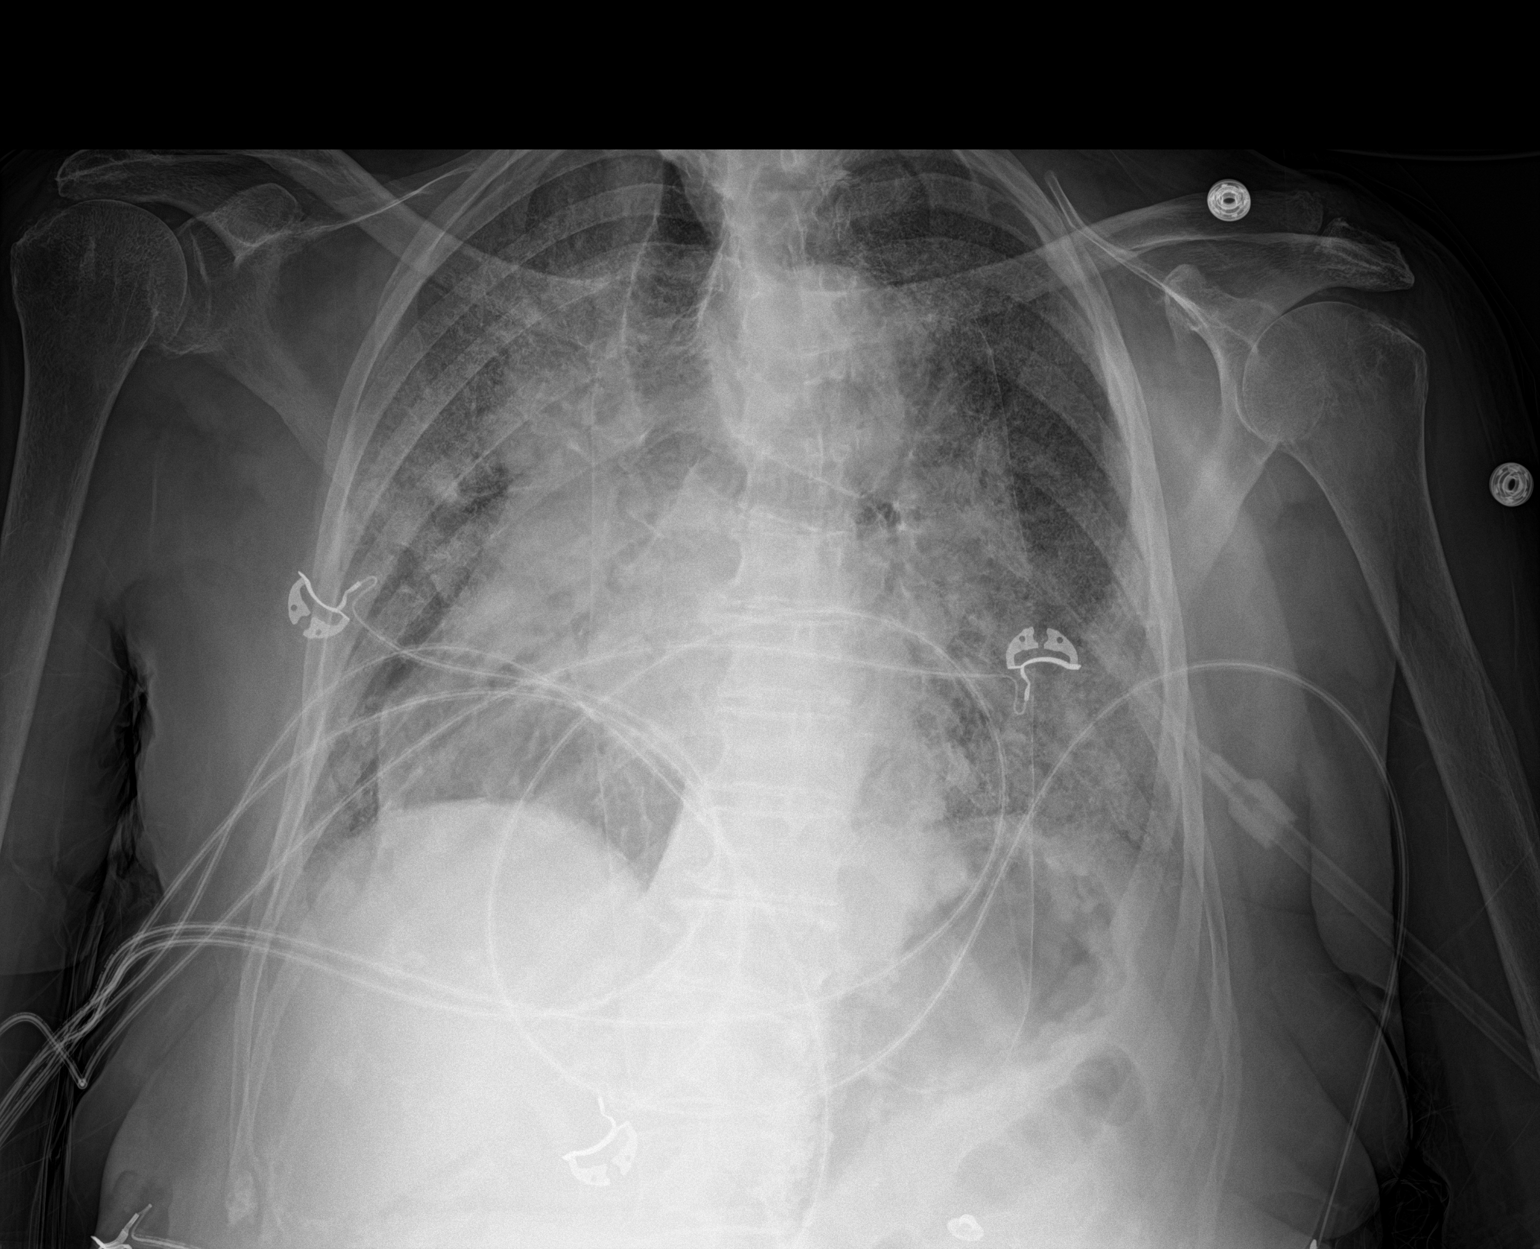

[1 of 1 positions shown; findings below may reference images not displayed]

FINDINGS: Stable cardiomegaly. No pneumothorax or pleural effusion is noted.
Mild bilateral diffuse faint lung opacities are noted consistent
with pneumonia or possibly edema. Bony thorax is unremarkable.
IMPRESSION: Mild bilateral diffuse lung opacities are noted consistent with
pneumonia or possibly edema.

## 2020-07-27 IMAGING — DX DG CHEST 1V PORT
1 series · 1 of 1 positions shown · non-contrast
Comparison: 07/11/2019

CLINICAL DATA: Shortness of breath.

EXAM:
PORTABLE CHEST 1 VIEW

[chest]
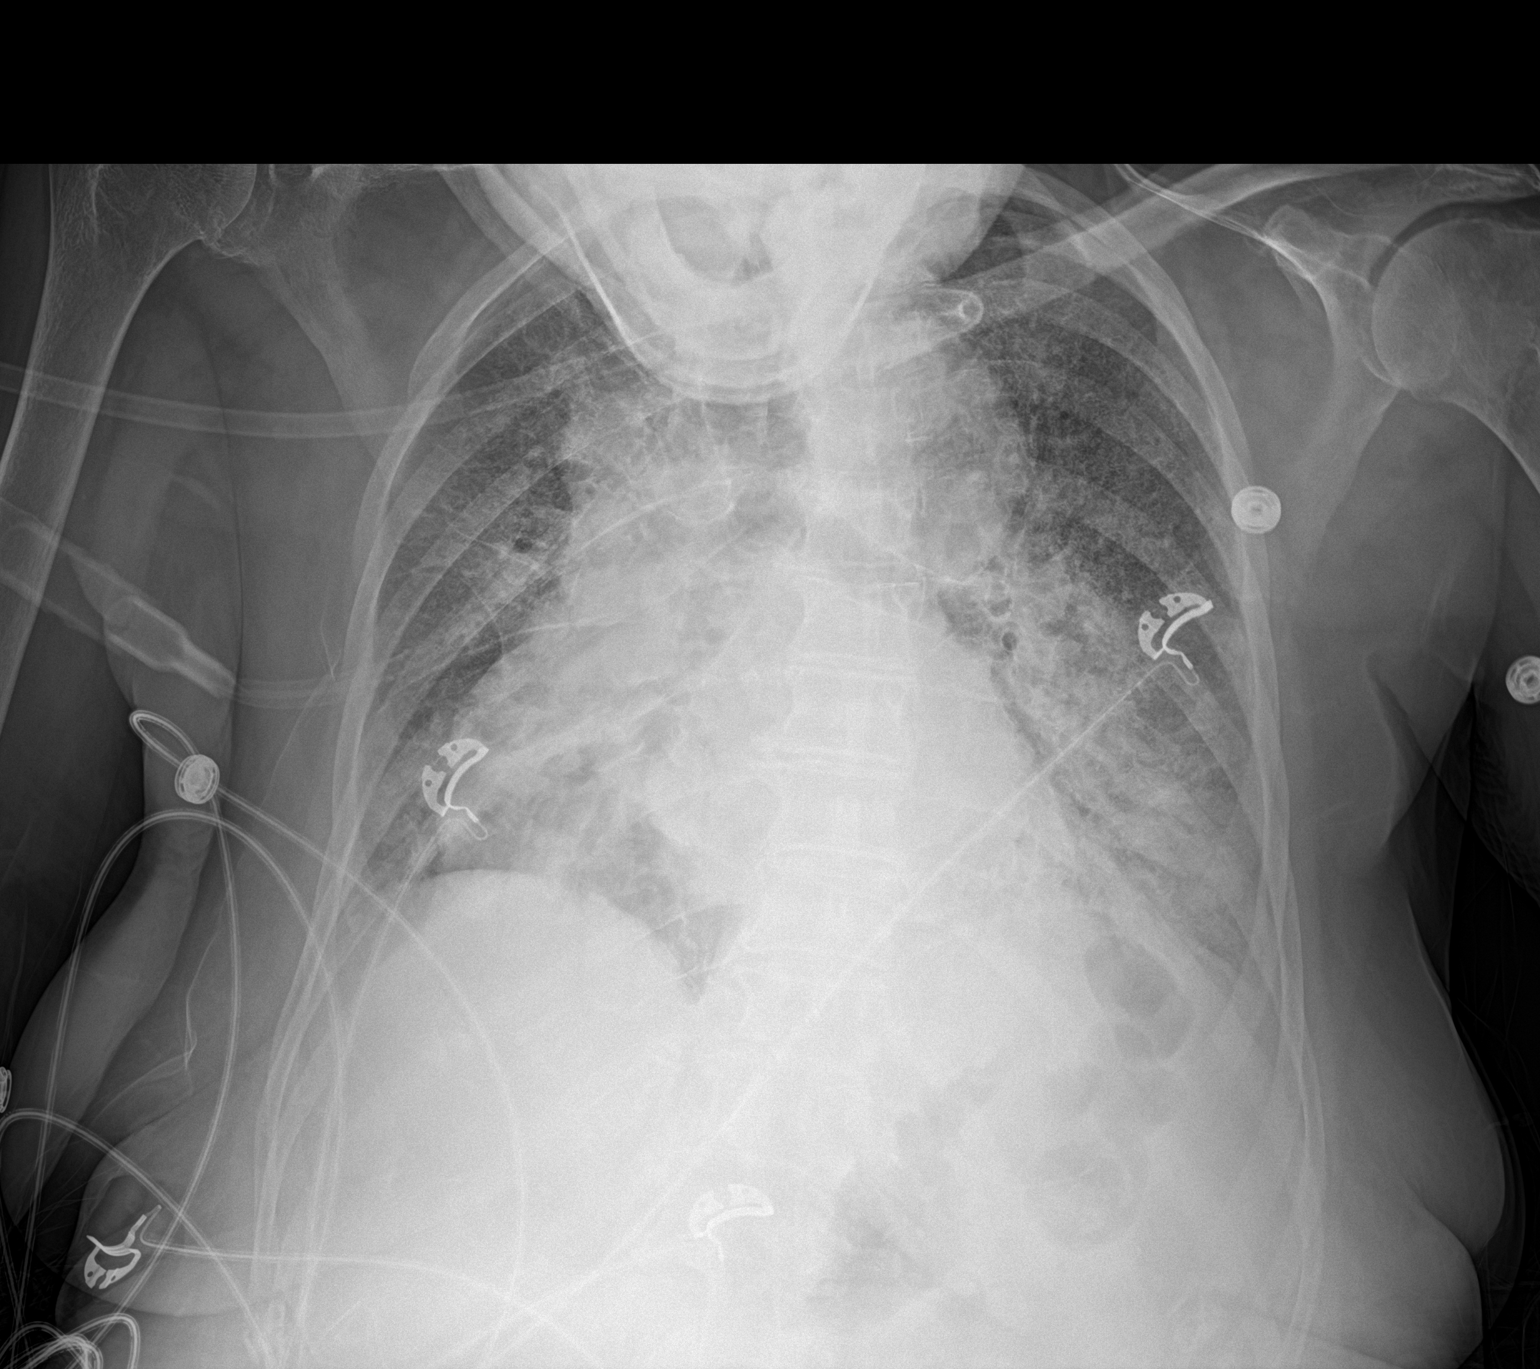

[1 of 1 positions shown; findings below may reference images not displayed]

FINDINGS: The patient remains rotated to the right with unchanged
cardiomediastinal silhouette including cardiomegaly. Fine
interstitial opacities are again seen diffusely throughout both
lungs, and there is also a similar appearance of patchy airspace
opacities which are most notable in the left lung base. No sizable
pleural effusion or pneumothorax is identified although the
patient's chin partially obscures the right greater than left lung
apices.
IMPRESSION: Unchanged bilateral interstitial and airspace opacities which may
reflect pneumonia or edema.
